# Patient Record
Sex: Female | Born: 1985 | Hispanic: Yes | Marital: Married | Smoking: Never smoker
Health system: Southern US, Community
[De-identification: ages and names within clinical notes are randomized; demographics above are authoritative.]

## PROBLEM LIST (undated history)

## (undated) ENCOUNTER — Inpatient Hospital Stay (HOSPITAL_COMMUNITY): Payer: Self-pay

## (undated) DIAGNOSIS — O24419 Gestational diabetes mellitus in pregnancy, unspecified control: Secondary | ICD-10-CM

---

## 2014-09-03 ENCOUNTER — Encounter (HOSPITAL_COMMUNITY): Payer: Self-pay | Admitting: *Deleted

## 2014-09-03 ENCOUNTER — Inpatient Hospital Stay (HOSPITAL_COMMUNITY)
Admission: AD | Admit: 2014-09-03 | Discharge: 2014-09-03 | Disposition: A | Discharge status: Home / Self Care | Payer: Self-pay | Source: Ambulatory Visit | Attending: Family Medicine | Admitting: Family Medicine

## 2014-09-03 ENCOUNTER — Inpatient Hospital Stay (HOSPITAL_COMMUNITY): Payer: Self-pay

## 2014-09-03 DIAGNOSIS — O209 Hemorrhage in early pregnancy, unspecified: Secondary | ICD-10-CM | POA: Insufficient documentation

## 2014-09-03 DIAGNOSIS — Z3A01 Less than 8 weeks gestation of pregnancy: Secondary | ICD-10-CM | POA: Insufficient documentation

## 2014-09-03 LAB — HIV ANTIBODY (ROUTINE TESTING W REFLEX): HIV 1&2 Ab, 4th Generation: NONREACTIVE

## 2014-09-03 LAB — URINALYSIS, ROUTINE W REFLEX MICROSCOPIC
Bilirubin Urine: NEGATIVE
Glucose, UA: NEGATIVE mg/dL
KETONES UR: NEGATIVE mg/dL
Leukocytes, UA: NEGATIVE
Nitrite: NEGATIVE
PROTEIN: NEGATIVE mg/dL
Specific Gravity, Urine: 1.02 (ref 1.005–1.030)
UROBILINOGEN UA: 0.2 mg/dL (ref 0.0–1.0)
pH: 6 (ref 5.0–8.0)

## 2014-09-03 LAB — URINE MICROSCOPIC-ADD ON

## 2014-09-03 LAB — CBC
HEMATOCRIT: 37.9 % (ref 36.0–46.0)
Hemoglobin: 12.6 g/dL (ref 12.0–15.0)
MCH: 28.3 pg (ref 26.0–34.0)
MCHC: 33.2 g/dL (ref 30.0–36.0)
MCV: 85.2 fL (ref 78.0–100.0)
Platelets: 243 10*3/uL (ref 150–400)
RBC: 4.45 MIL/uL (ref 3.87–5.11)
RDW: 13.9 % (ref 11.5–15.5)
WBC: 5.3 10*3/uL (ref 4.0–10.5)

## 2014-09-03 LAB — WET PREP, GENITAL
CLUE CELLS WET PREP: NONE SEEN
Trich, Wet Prep: NONE SEEN
Yeast Wet Prep HPF POC: NONE SEEN

## 2014-09-03 LAB — HCG, QUANTITATIVE, PREGNANCY: hCG, Beta Chain, Quant, S: 4592 m[IU]/mL — ABNORMAL HIGH (ref <5)

## 2014-09-03 LAB — ABO/RH: ABO/RH(D): O POS

## 2014-09-03 LAB — POCT PREGNANCY, URINE: PREG TEST UR: POSITIVE — AB

## 2014-09-03 NOTE — Discharge Instructions (Signed)
Hemorragia vaginal durante el embarazo (primer trimestre) °(Vaginal Bleeding During Pregnancy, First Trimester) °Durante los primeros meses de embarazo, es común tener una pequeña hemorragia vaginal (manchas). A veces, la hemorragia es normal y no representa un problema, pero en algunas ocasiones es un síntoma de algo grave. Asegúrese de decirle a su médico de inmediato si tiene algún tipo de hemorragia vaginal. °CUIDADOS EN EL HOGAR °· Controle su afección para ver si hay cambios. °· Siga las indicaciones de su médico con respecto al grado de actividad que puede tener. °· Si debe hacer reposo en cama: °¨ Es posible que deba quedarse en cama y levantarse únicamente para ir al baño. °¨ Quizás le permitan hacer algunas actividades. °¨ Si es necesario, planifique que alguien la ayude. °· Escriba: °¨ La cantidad de toallas higiénicas que usa cada día. °¨ La frecuencia con la que se cambia las toallas higiénicas. °¨ Indique que tan empapados (saturados) están. °· No use tampones. °· No se haga duchas vaginales. °· No tenga relaciones sexuales ni orgasmos hasta que el médico la autorice. °· Si elimina tejido por la vagina, guárdelo para mostrárselo al médico. °· Tome los medicamentos solamente como se lo haya indicado el médico. °· No tome aspirina, ya que puede causar hemorragias. °· Concurra a todas las visitas de control como se lo haya indicado el médico. °SOLICITE AYUDA SI:  °· Tiene una hemorragia vaginal. °· Tiene cólicos. °· Tiene dolores de parto. °· Tiene fiebre que no desaparece después de tomar medicamentos. °SOLICITE AYUDA DE INMEDIATO SI:  °· Siente cólicos muy intensos en la espalda o en el vientre (abdomen). °· Elimina coágulos grandes o tejido por la vagina. °· Tiene más hemorragia. °· Se siente débil o que va a desvanecerse. °· Pierde el conocimiento (se desmaya). °· Tiene escalofríos. °· Tiene una pérdida importante o sale líquido a borbotones por la vagina. °· Se desmaya mientras defeca. °ASEGÚRESE DE  QUE: °· Comprende estas instrucciones. °· Controlará su afección. °· Recibirá ayuda de inmediato si no mejora o si empeora. °Document Released: 01/22/2014 °ExitCare® Patient Information ©2015 [employer]. This information is not intended to replace advice given to you by your health care provider. Make sure you discuss any questions you have with your health care provider. ° °

## 2014-09-03 NOTE — MAU Note (Signed)
Patient presents [redacted] weeks pregnant with complaints of vaginal bleeding and abdominal pain.

## 2014-09-03 NOTE — MAU Provider Note (Signed)
Chief Complaint  Patient presents with  . Abdominal Pain  . Vaginal Bleeding   Interpreter here for interview and exam  Subjective [name] Marshall 28 y.o.  G3P2002 at 7964w0d by LMP presents with onset 5 days ago of first episode of small amount pink vaginal spotting. The bleeding changed to read and now brown. She is also having mild menstrual-like crampy lower abdominal pain. Last intercourse yesterday.  Denies irritative vaginal discharge. No dysuria or hematuria.  Blood type: unknown  Pregnancy course: NPC  Pertinent Medical History:[place] Pertinent Ob/Gyn History: C/S x2 Pertinent Surgical History: C/S Pertinent Social History: nonsmoker  No prescriptions prior to admission    Allergies not on file   Objective   Filed Vitals:   09/03/14 1325  BP: 134/64  Pulse: 103  Temp: 98.1 F (36.7 C)  Resp: 16     Physical Exam General: WN/WD in NAD  Abdom: soft, NT External genitalia: normal; BUS neg  SSE: small amount brown blood; cervix with no lesions, appears closed Bimanual: Cervix closed, long; uterus NT, 4-6 weeks size; adnexa nontender, no masses   Lab Results Results for orders placed or performed during the hospital encounter of 09/03/14 (from the past 24 hour(s))  Urinalysis, Routine w reflex microscopic     Status: Abnormal   Collection Time: 09/03/14  1:44 PM  Result Value Ref Range   Color, Urine YELLOW YELLOW   APPearance CLEAR CLEAR   Specific Gravity, Urine 1.020 1.005 - 1.030   pH 6.0 5.0 - 8.0   Glucose, UA NEGATIVE NEGATIVE mg/dL   Hgb urine dipstick TRACE (A) NEGATIVE   Bilirubin Urine NEGATIVE NEGATIVE   Ketones, ur NEGATIVE NEGATIVE mg/dL   Protein, ur NEGATIVE NEGATIVE mg/dL   Urobilinogen, UA 0.2 0.0 - 1.0 mg/dL   Nitrite NEGATIVE NEGATIVE   Leukocytes, UA NEGATIVE NEGATIVE  Urine microscopic-add on     Status: Abnormal   Collection Time: 09/03/14  1:44 PM  Result Value Ref Range   Squamous Epithelial / LPF FEW (A) RARE   WBC, UA 0-2 <3  WBC/hpf   Urine-Other MUCOUS PRESENT   Pregnancy, urine POC     Status: Abnormal   Collection Time: 09/03/14  1:51 PM  Result Value Ref Range   Preg Test, Ur POSITIVE (A) NEGATIVE  hCG, quantitative, pregnancy     Status: Abnormal   Collection Time: 09/03/14  3:35 PM  Result Value Ref Range   hCG, Beta Chain, Quant, S 4592 (H) <5 mIU/mL    Ultrasound  CLINICAL DATA: Bleeding in early pregnancy  EXAM: OBSTETRIC <14 WK US AND TRANSVAGINAL OB US  TECHNIQUE: Both transabdominal and transvaginal ultrasound examinations were performed for complete evaluation of the gestation as well as the maternal uterus, adnexal regions, and pelvic cul-de-sac. Transvaginal technique was performed to assess early pregnancy.  COMPARISON: None.  FINDINGS: Intrauterine gestational sac: Visualized/normal in shape.  Yolk sac: Not visualized  Embryo: Not visualized  Cardiac Activity: Not visual  Heart Rate: bpm  MSD: 8.3 mm 5 w 3 d  CRL: mm w d US EDC: 05/09/2015  Maternal uterus/adnexae: No subchorionic hemorrhage. No adnexal masses or free fluid  IMPRESSION: Early intrauterine gestational sac without fetal pole or yolk sac currently. This could be followed with repeat ultrasound in 2 weeks to ensure continued expected progression. No acute maternal findings.   Electronically Signed  By: Charlett NoseKevin Dover M.D.      Assessment 1. Bleeding in early pregnancy   Pregnancy location unknown and viability not determined  [id number] at 5454w0d  Plan    GC/CT, HIV sent Discharge home with ectopic precautions See AVS for pt education   Medication List    STOP taking these medications        ibuprofen 200 MG tablet  Commonly known as:  ADVIL,MOTRIN     sodium-potassium bicarbonate Tbef dissolvable tablet  Commonly known as:  ALKA-SELTZER GOLD      TAKE these medications        prenatal multivitamin Tabs tablet  Take 1 tablet by  mouth daily at 12 noon.         Follow-up Information    Follow up with Nursepractioner [name] Marshall In 2 days.   Why:  Repeat blood test       [name] Marshall 09/03/2014 3:14 PM

## 2014-09-04 LAB — GC/CHLAMYDIA PROBE AMP
CT PROBE, AMP APTIMA: NEGATIVE
GC Probe RNA: NEGATIVE

## 2014-09-16 ENCOUNTER — Inpatient Hospital Stay (HOSPITAL_COMMUNITY)
Admission: AD | Admit: 2014-09-16 | Discharge: 2014-09-16 | Disposition: A | Discharge status: Home / Self Care | Payer: Self-pay | Source: Ambulatory Visit | Attending: Obstetrics and Gynecology | Admitting: Obstetrics and Gynecology

## 2014-09-16 ENCOUNTER — Inpatient Hospital Stay (HOSPITAL_COMMUNITY): Payer: Self-pay

## 2014-09-16 ENCOUNTER — Encounter (HOSPITAL_COMMUNITY): Payer: Self-pay | Admitting: *Deleted

## 2014-09-16 DIAGNOSIS — O034 Incomplete spontaneous abortion without complication: Secondary | ICD-10-CM | POA: Insufficient documentation

## 2014-09-16 DIAGNOSIS — Z3A01 Less than 8 weeks gestation of pregnancy: Secondary | ICD-10-CM | POA: Insufficient documentation

## 2014-09-16 DIAGNOSIS — O032 Embolism following incomplete spontaneous abortion: Secondary | ICD-10-CM

## 2014-09-16 DIAGNOSIS — O209 Hemorrhage in early pregnancy, unspecified: Secondary | ICD-10-CM

## 2014-09-16 HISTORY — DX: Gestational diabetes mellitus in pregnancy, unspecified control: O24.419

## 2014-09-16 LAB — CBC
HCT: 35.6 % — ABNORMAL LOW (ref 36.0–46.0)
HEMOGLOBIN: 12 g/dL (ref 12.0–15.0)
MCH: 28.7 pg (ref 26.0–34.0)
MCHC: 33.7 g/dL (ref 30.0–36.0)
MCV: 85.2 fL (ref 78.0–100.0)
Platelets: 236 10*3/uL (ref 150–400)
RBC: 4.18 MIL/uL (ref 3.87–5.11)
RDW: 13.5 % (ref 11.5–15.5)
WBC: 7.3 10*3/uL (ref 4.0–10.5)

## 2014-09-16 LAB — URINE MICROSCOPIC-ADD ON

## 2014-09-16 LAB — GLUCOSE, CAPILLARY: GLUCOSE-CAPILLARY: 95 mg/dL (ref 70–99)

## 2014-09-16 LAB — URINALYSIS, ROUTINE W REFLEX MICROSCOPIC
Bilirubin Urine: NEGATIVE
GLUCOSE, UA: 100 mg/dL — AB
Ketones, ur: 15 mg/dL — AB
Nitrite: POSITIVE — AB
Specific Gravity, Urine: 1.02 (ref 1.005–1.030)
Urobilinogen, UA: 2 mg/dL — ABNORMAL HIGH (ref 0.0–1.0)
pH: 7.5 (ref 5.0–8.0)

## 2014-09-16 LAB — HCG, QUANTITATIVE, PREGNANCY: hCG, Beta Chain, Quant, S: 11833 m[IU]/mL — ABNORMAL HIGH (ref <5)

## 2014-09-16 MED ORDER — OXYCODONE-ACETAMINOPHEN 5-325 MG PO TABS: 1.0000 | ORAL_TABLET | ORAL | Status: DC | PRN

## 2014-09-16 NOTE — Progress Notes (Signed)
Assisted provider with interpretation of results from ultrasound.  Spanish Interpreter - [name] GlassmanBenita Marshall

## 2014-09-16 NOTE — MAU Provider Note (Signed)
No chief complaint on file. Interpreter here  Subjective [name] Marshall 28 y.o.  G3P2002 at 1477w6d by LMP with second visit for EPB. On 09/03/14 US showed IUGS c/w 8025w2d, no YS. Quant was 4592. She DNKA for F/U quant scheduled for 12/16. Since then Was having light spotting until it became heavier yesterday and today has had bright red bleeding with clots. Filled 2 pads with blood today. Did not pass any obvious tissue.Having cramping as well. Denies irritative vaginal discharge. No dysuria or hematuria.  GC/CT, WP, HIV  were neg. Pregnancy undesired. Pregnancy diagnosed at visit to get Nexplanon.  Blood type: O pos  Pregnancy course: NPC  Pertinent Medical History: n/c Pertinent Ob/Gyn History: C/S x2 Pertinent Surgical History: C/S Pertinent Social History: nonsmoker  Prescriptions prior to admission  Medication Sig Dispense Refill Last Dose  . Prenatal Vit-Fe Fumarate-FA (MULTIVITAMIN-PRENATAL) 27-0.8 MG TABS tablet Take 1 tablet by mouth daily at 12 noon.   Past Week at Unknown time  . Prenatal Vit-Fe Fumarate-FA (PRENATAL MULTIVITAMIN) TABS tablet Take 1 tablet by mouth daily at 12 noon.   09/03/2014 at Unknown time    No Known Allergies   Objective   Filed Vitals:   09/16/14 1916  BP: 123/83  Pulse: 92  Temp: 98.5 F (36.9 C)  Resp: 16     Physical Exam General: WN/WD in NAD  Abdom: soft, NT External genitalia: normal; BUS neg  SSE: moderate amount blood, 2-4 cm clots removed from cx with spongestick; cervix with no lesions, appears closed Bimanual: Cervix closed, long; uterus anteverted, NT, 4-6 weeks size; adnexa nontender, no masses   Lab Results Results for orders placed or performed during the hospital encounter of 09/16/14 (from the past 24 hour(s))  Urinalysis, Routine w reflex microscopic     Status: Abnormal   Collection Time: 09/16/14  6:54 PM  Result Value Ref Range   Color, Urine RED (A) YELLOW   APPearance TURBID (A) CLEAR   Specific Gravity, Urine  1.020 1.005 - 1.030   pH 7.5 5.0 - 8.0   Glucose, UA 100 (A) NEGATIVE mg/dL   Hgb urine dipstick LARGE (A) NEGATIVE   Bilirubin Urine NEGATIVE NEGATIVE   Ketones, ur 15 (A) NEGATIVE mg/dL   Protein, ur >161>300 (A) NEGATIVE mg/dL   Urobilinogen, UA 2.0 (H) 0.0 - 1.0 mg/dL   Nitrite POSITIVE (A) NEGATIVE   Leukocytes, UA SMALL (A) NEGATIVE  Urine microscopic-add on     Status: None   Collection Time: 09/16/14  6:54 PM  Result Value Ref Range   WBC, UA 0-2 <3 WBC/hpf   RBC / HPF TOO NUMEROUS TO COUNT <3 RBC/hpf   Bacteria, UA RARE RARE  CBC     Status: Abnormal   Collection Time: 09/16/14  7:35 PM  Result Value Ref Range   WBC 7.3 4.0 - 10.5 K/uL   RBC 4.18 3.87 - 5.11 MIL/uL   Hemoglobin 12.0 12.0 - 15.0 g/dL   HCT 09.635.6 (L) 04.536.0 - 40.946.0 %   MCV 85.2 78.0 - 100.0 fL   MCH 28.7 26.0 - 34.0 pg   MCHC 33.7 30.0 - 36.0 g/dL   RDW 81.113.5 91.411.5 - 78.215.5 %   Platelets 236 150 - 400 K/uL  Glucose, capillary     Status: None   Collection Time: 09/16/14  7:54 PM  Result Value Ref Range   Glucose-Capillary 95 70 - 99 mg/dL       Ref Range 9:567:35 PM  13d ago  hCG, Beta Chain, Quant, S <5 mIU/mL 11833 (H) 4592 (H)CM   Comments:          Ultrasound  No results found. MAU Course Discussed EPF  Likely incomplete SAB with pt and option of cytotec or expectant managaement> chooses expectant management  Assessment 1. Incomplete miscarriage with blood clot   2. Bleeding in early pregnancy   Most likely inc SAB   Plan     Discharge home with bleeding precautions See AVS for pt education   Medication List    STOP taking these medications        multivitamin-prenatal 27-0.8 MG Tabs tablet      TAKE these medications        oxyCODONE-acetaminophen 5-325 MG per tablet  Commonly known as:  PERCOCET/ROXICET  Take 1 tablet by mouth every 4 (four) hours as needed.     prenatal multivitamin Tabs tablet  Take 1 tablet by mouth daily at 12 noon.         Follow-up Information     Follow up with [facility] On 09/24/2014.   Why:  For lab test and [facility]   Contact information:   [address] [place] [id number] [phone]        [facility],Daurice Ovando 09/16/2014 7:14 PM

## 2014-09-16 NOTE — Progress Notes (Signed)
Assisted RN with interpretation of initial assessment. °Spanish Interpreter - Benita Sanchez  °

## 2014-09-16 NOTE — Progress Notes (Signed)
Assisted Midwife with interpretation of procedure/assessment.  Spanish Interpreter - Joselyn GlassmanBenita Sanchez

## 2014-09-16 NOTE — MAU Note (Signed)
Pt was here last week with sm amt bleeding and her uterus was enlarged upon U/S, but nothing showed up in the uterus.  Bleeding increased today, went through one pad today and had several sm clots.  Pt reports a very sm amt of low abd cramping. Denies any probs with urinating

## 2014-09-16 NOTE — Progress Notes (Signed)
Assisted admissions with interpretation of patient information.  Spanish Interpreter - Joselyn GlassmanBenita Sanchez

## 2014-09-16 NOTE — Progress Notes (Signed)
Assisted Ultrasound tech with interpretation of ultrasound procedure.  Spanish Interpreter - [name] GlassmanBenita Marshall

## 2014-09-16 NOTE — Progress Notes (Signed)
Assisted RN with interpretation of discharge instructions  °Spanish Interpreter - Benita Sanchez °

## 2014-09-24 ENCOUNTER — Other Ambulatory Visit: Payer: Self-pay

## 2014-09-24 ENCOUNTER — Telehealth: Payer: Self-pay | Admitting: *Deleted

## 2014-09-24 DIAGNOSIS — O209 Hemorrhage in early pregnancy, unspecified: Secondary | ICD-10-CM

## 2014-09-24 LAB — HCG, QUANTITATIVE, PREGNANCY: HCG, BETA CHAIN, QUANT, S: 237 m[IU]/mL

## 2014-09-24 NOTE — Telephone Encounter (Signed)
Contacted patient with Spanish interpreter Pearletha Alfred,  Pt informed of lab appointment.  Pt was unaware of appointment, pt can come today @ 1:30pm.

## 2014-09-25 ENCOUNTER — Telehealth: Payer: Self-pay

## 2014-09-25 NOTE — Telephone Encounter (Signed)
-----   Message from Levie HeritageJacob J Stinson, DO sent at 09/24/2014  8:38 PM EST ----- Regarding: bHCG quant HCG quant 237.  Should repeat quant in 2 weeks

## 2014-09-26 NOTE — Telephone Encounter (Signed)
Called pt with Spanish [name] Marshall informed pt that we need to her to come in on January 18th.  I explained to pt what the purpose of a repeat beta is to make sure that her levels fall less than 2.  Pt stated that she would be able to come in on January 18th @ 0900 for lab draw.  Pt had no further questions.

## 2014-10-08 ENCOUNTER — Other Ambulatory Visit: Payer: Self-pay

## 2014-10-08 DIAGNOSIS — O209 Hemorrhage in early pregnancy, unspecified: Secondary | ICD-10-CM

## 2014-10-09 LAB — HCG, QUANTITATIVE, PREGNANCY: HCG, BETA CHAIN, QUANT, S: 134 m[IU]/mL

## 2014-10-10 ENCOUNTER — Telehealth: Payer: Self-pay | Admitting: General Practice

## 2014-10-10 NOTE — Telephone Encounter (Signed)
-----   Message from Tereso NewcomerUgonna A Anyanwu, MD sent at 10/09/2014 12:49 PM EST ----- Repeat HCG in two weeks, can also have visit to discuss contraception/future pregnancy plans at that time with any provider.

## 2014-10-10 NOTE — Telephone Encounter (Signed)
Called patient with [facility] for interpreter, no answer- left message stating we are trying to reach you with results, please call us back at the clinics

## 2014-10-11 NOTE — Telephone Encounter (Signed)
Patient informed of results and follow up appointment made.

## 2014-10-11 NOTE — Telephone Encounter (Signed)
Beronica to schedule appointment for 2 weeks.

## 2014-10-25 ENCOUNTER — Encounter: Payer: Self-pay | Admitting: Family Medicine

## 2014-10-25 ENCOUNTER — Ambulatory Visit (INDEPENDENT_AMBULATORY_CARE_PROVIDER_SITE_OTHER): Payer: Self-pay | Admitting: Family Medicine

## 2014-10-25 VITALS — BP 136/67 | HR 84 | Temp 98.5°F | Wt 136.2 lb

## 2014-10-25 DIAGNOSIS — O034 Incomplete spontaneous abortion without complication: Secondary | ICD-10-CM

## 2014-10-25 NOTE — Progress Notes (Signed)
   Subjective:    Patient ID: [name] Marshall, female    DOB: 03-13-1986, 29 y.o.   MRN: [id number]  HPI  [id number]  On 09/03/14 US showed IUGS c/w 3155w2d, no YS. Quant was 4592 > 11833 > 237 >134.  No pain, spotting resolved.  LMP: Saturday or Sunday (Jan 24th).   Review of Systems  Constitutional: Negative for chills and diaphoresis.  Respiratory: Negative for cough and shortness of breath.   Cardiovascular: Negative for leg swelling.  Gastrointestinal: Negative for abdominal pain, diarrhea, constipation and anal bleeding.  Endocrine: Negative for cold intolerance and heat intolerance.  Genitourinary: Negative for dysuria, urgency, decreased urine volume and difficulty urinating.  Neurological: Negative for headaches.       Objective:   Physical Exam  Constitutional: She appears well-developed and well-nourished. No distress.  HENT:  Mouth/Throat: Mucous membranes are moist. Pharynx is normal.  Eyes: Conjunctivae and EOM are normal.  Neck: No adenopathy.  Cardiovascular: Normal rate and S2 normal.   Pulmonary/Chest: Effort normal.  Abdominal: She exhibits no distension. There is no tenderness.  Musculoskeletal: Normal range of motion.  Neurological: She is alert. No cranial nerve deficit. Coordination normal.  Skin: Skin is warm. No rash noted. She is not diaphoretic. No pallor.          Assessment & Plan:  [name] was seen today for follow-up.  Diagnoses and associated orders for this visit:  Incomplete spontaneous abortion - B-HCG Quant - nexplanon to be placed at [facility]  Perry MountACOSTA,Jojuan Champney Evee, MD 1:39 PM

## 2014-10-25 NOTE — Progress Notes (Signed)
Pt is interested in nexplanon. Will go to health dept for this.

## 2014-10-26 LAB — HCG, QUANTITATIVE, PREGNANCY: HCG, BETA CHAIN, QUANT, S: 11.1 m[IU]/mL

## 2014-10-29 ENCOUNTER — Telehealth: Payer: Self-pay | Admitting: *Deleted

## 2014-10-29 NOTE — Telephone Encounter (Signed)
Called [name] Marshall with interpreter Maretta LosBlanca Lindner and gave her results of bhcg and explained is dropping normally , but want it to be less than 2.0. Informed her doctor wants her to come on 18th for bhcg only.  Donald SivaRocio says she can't come anyday but Tuesday because husband is off then, wants to come 11/13/14 at 9am,   I explained that is fine, as long as she is not having any problems, She voiced understanding.

## 2014-10-29 NOTE — Telephone Encounter (Signed)
-----   Message from Perry MountKristy Arilla Acosta, MD sent at 10/27/2014  3:36 PM EST ----- Please have patient come back for repeat HCG quant on 2-18.  Now down to 11.1, moving in right direction and decreasing appropriately.  Needs to be less than 2.  Spanish only pt  [place]  ----- Message -----    From: Lab in [facility] Interface    Sent: 10/26/2014   1:03 AM      To: Perry MountKristy Edie Acosta, MD

## 2014-11-13 ENCOUNTER — Other Ambulatory Visit: Payer: Self-pay

## 2014-11-13 DIAGNOSIS — O034 Incomplete spontaneous abortion without complication: Secondary | ICD-10-CM

## 2014-11-14 ENCOUNTER — Telehealth: Payer: Self-pay | Admitting: General Practice

## 2014-11-14 LAB — HCG, QUANTITATIVE, PREGNANCY: hCG, Beta Chain, Quant, S: <2 m[IU]/mL

## 2014-11-14 NOTE — Telephone Encounter (Signed)
-----   Message from Tereso NewcomerUgonna A Anyanwu, MD sent at 11/14/2014 11:45 AM EST ----- HCG is <2.0. [facility] as previously discussed with Dr. Loreta AveAcosta. Spanish speaking.  Please call to inform patient of results and recommendations.

## 2014-11-14 NOTE — Telephone Encounter (Signed)
Called patient with [name] Marshall and informed her of results and recommendations. Patient verbalized understanding and had no questions  

## 2016-01-04 ENCOUNTER — Emergency Department (HOSPITAL_COMMUNITY): Payer: Medicaid Other

## 2016-01-04 ENCOUNTER — Inpatient Hospital Stay (HOSPITAL_COMMUNITY)
Admission: EM | Admit: 2016-01-04 | Discharge: 2016-01-09 | DRG: 330 | Disposition: A | Discharge status: Home / Self Care | Payer: Medicaid Other | Attending: Surgery | Admitting: Surgery

## 2016-01-04 ENCOUNTER — Encounter (HOSPITAL_COMMUNITY): Payer: Self-pay

## 2016-01-04 DIAGNOSIS — Z833 Family history of diabetes mellitus: Secondary | ICD-10-CM

## 2016-01-04 DIAGNOSIS — K567 Ileus, unspecified: Secondary | ICD-10-CM | POA: Diagnosis not present

## 2016-01-04 DIAGNOSIS — K353 Acute appendicitis with localized peritonitis: Secondary | ICD-10-CM | POA: Diagnosis present

## 2016-01-04 DIAGNOSIS — K9189 Other postprocedural complications and disorders of digestive system: Secondary | ICD-10-CM | POA: Diagnosis not present

## 2016-01-04 DIAGNOSIS — R109 Unspecified abdominal pain: Secondary | ICD-10-CM | POA: Diagnosis present

## 2016-01-04 DIAGNOSIS — Z8249 Family history of ischemic heart disease and other diseases of the circulatory system: Secondary | ICD-10-CM

## 2016-01-04 DIAGNOSIS — K3580 Unspecified acute appendicitis: Secondary | ICD-10-CM

## 2016-01-04 DIAGNOSIS — K630 Abscess of intestine: Secondary | ICD-10-CM | POA: Diagnosis present

## 2016-01-04 DIAGNOSIS — R1031 Right lower quadrant pain: Secondary | ICD-10-CM | POA: Diagnosis present

## 2016-01-04 LAB — COMPREHENSIVE METABOLIC PANEL
ALK PHOS: 62 U/L (ref 38–126)
ALT: 38 U/L (ref 14–54)
AST: 81 U/L — ABNORMAL HIGH (ref 15–41)
Albumin: 4.2 g/dL (ref 3.5–5.0)
Anion gap: 12 (ref 5–15)
BILIRUBIN TOTAL: 0.8 mg/dL (ref 0.3–1.2)
BUN: 12 mg/dL (ref 6–20)
CALCIUM: 8.6 mg/dL — AB (ref 8.9–10.3)
CO2: 21 mmol/L — AB (ref 22–32)
CREATININE: 0.72 mg/dL (ref 0.44–1.00)
Chloride: 101 mmol/L (ref 101–111)
GFR calc non Af Amer: >60 mL/min (ref >60)
Glucose, Bld: 131 mg/dL — ABNORMAL HIGH (ref 65–99)
Potassium: 3.2 mmol/L — ABNORMAL LOW (ref 3.5–5.1)
SODIUM: 134 mmol/L — AB (ref 135–145)
TOTAL PROTEIN: 7.9 g/dL (ref 6.5–8.1)

## 2016-01-04 LAB — WET PREP, GENITAL
Clue Cells Wet Prep HPF POC: NONE SEEN
SPERM: NONE SEEN
TRICH WET PREP: NONE SEEN
Yeast Wet Prep HPF POC: NONE SEEN

## 2016-01-04 LAB — DIFFERENTIAL
BASOS PCT: 0 %
Basophils Absolute: 0 10*3/uL (ref 0.0–0.1)
EOS PCT: 0 %
Eosinophils Absolute: 0 10*3/uL (ref 0.0–0.7)
LYMPHS PCT: 6 %
Lymphs Abs: 1.1 10*3/uL (ref 0.7–4.0)
Monocytes Absolute: 1.1 10*3/uL — ABNORMAL HIGH (ref 0.1–1.0)
Monocytes Relative: 6 %
NEUTROS ABS: 16.9 10*3/uL — AB (ref 1.7–7.7)
NEUTROS PCT: 88 %

## 2016-01-04 LAB — URINALYSIS, ROUTINE W REFLEX MICROSCOPIC
Bilirubin Urine: NEGATIVE
Glucose, UA: NEGATIVE mg/dL
Hgb urine dipstick: NEGATIVE
KETONES UR: 40 mg/dL — AB
Nitrite: NEGATIVE
PROTEIN: NEGATIVE mg/dL
Specific Gravity, Urine: 1.027 (ref 1.005–1.030)
pH: 6 (ref 5.0–8.0)

## 2016-01-04 LAB — CBC
HCT: 36.9 % (ref 36.0–46.0)
Hemoglobin: 12.6 g/dL (ref 12.0–15.0)
MCH: 26.7 pg (ref 26.0–34.0)
MCHC: 34.1 g/dL (ref 30.0–36.0)
MCV: 78.2 fL (ref 78.0–100.0)
PLATELETS: 239 10*3/uL (ref 150–400)
RBC: 4.72 MIL/uL (ref 3.87–5.11)
RDW: 14.4 % (ref 11.5–15.5)
WBC: 19.8 10*3/uL — ABNORMAL HIGH (ref 4.0–10.5)

## 2016-01-04 LAB — LIPASE, BLOOD: Lipase: 16 U/L (ref 11–51)

## 2016-01-04 LAB — URINE MICROSCOPIC-ADD ON: RBC / HPF: NONE SEEN RBC/hpf (ref 0–5)

## 2016-01-04 LAB — POC URINE PREG, ED: PREG TEST UR: NEGATIVE

## 2016-01-04 MED ORDER — SODIUM CHLORIDE 0.9 % IV SOLN
Freq: Once | INTRAVENOUS | Status: AC
  Administered 2016-01-04: 18:00:00 via INTRAVENOUS

## 2016-01-04 MED ORDER — SODIUM CHLORIDE 0.9 % IV BOLUS (SEPSIS)
1000.0000 mL | Freq: Once | INTRAVENOUS | Status: AC
  Administered 2016-01-04: 1000 mL via INTRAVENOUS

## 2016-01-04 MED ORDER — HYDROMORPHONE HCL 1 MG/ML IJ SOLN
1.0000 mg | INTRAMUSCULAR | Status: DC | PRN
  Administered 2016-01-04 – 2016-01-06 (×10): 1 mg via INTRAVENOUS
  Filled 2016-01-04 (×11): qty 1

## 2016-01-04 MED ORDER — METRONIDAZOLE IN NACL 5-0.79 MG/ML-% IV SOLN
500.0000 mg | Freq: Three times a day (TID) | INTRAVENOUS | Status: DC
  Administered 2016-01-05 – 2016-01-09 (×13): 500 mg via INTRAVENOUS
  Filled 2016-01-04 (×14): qty 100

## 2016-01-04 MED ORDER — ACETAMINOPHEN 500 MG PO TABS
1000.0000 mg | ORAL_TABLET | Freq: Four times a day (QID) | ORAL | Status: DC
  Administered 2016-01-04 – 2016-01-09 (×15): 1000 mg via ORAL
  Filled 2016-01-04 (×34): qty 2

## 2016-01-04 MED ORDER — METRONIDAZOLE IN NACL 5-0.79 MG/ML-% IV SOLN
500.0000 mg | Freq: Once | INTRAVENOUS | Status: AC
  Administered 2016-01-04: 500 mg via INTRAVENOUS
  Filled 2016-01-04: qty 100

## 2016-01-04 MED ORDER — PANTOPRAZOLE SODIUM 40 MG IV SOLR
40.0000 mg | Freq: Every day | INTRAVENOUS | Status: DC
  Administered 2016-01-04 – 2016-01-08 (×5): 40 mg via INTRAVENOUS
  Filled 2016-01-04 (×6): qty 40

## 2016-01-04 MED ORDER — ACETAMINOPHEN 325 MG PO TABS
650.0000 mg | ORAL_TABLET | Freq: Once | ORAL | Status: AC
  Administered 2016-01-04: 650 mg via ORAL
  Filled 2016-01-04: qty 2

## 2016-01-04 MED ORDER — IOPAMIDOL (ISOVUE-300) INJECTION 61%
100.0000 mL | Freq: Once | INTRAVENOUS | Status: AC | PRN
  Administered 2016-01-04: 100 mL via INTRAVENOUS

## 2016-01-04 MED ORDER — KCL IN DEXTROSE-NACL 20-5-0.9 MEQ/L-%-% IV SOLN
INTRAVENOUS | Status: DC
  Administered 2016-01-04 – 2016-01-08 (×8): via INTRAVENOUS
  Filled 2016-01-04 (×12): qty 1000

## 2016-01-04 MED ORDER — MORPHINE SULFATE (PF) 4 MG/ML IV SOLN
4.0000 mg | Freq: Once | INTRAVENOUS | Status: AC
  Administered 2016-01-04: 4 mg via INTRAVENOUS
  Filled 2016-01-04: qty 1

## 2016-01-04 MED ORDER — DEXTROSE 5 % IV SOLN
2.0000 g | Freq: Once | INTRAVENOUS | Status: AC
  Administered 2016-01-04: 2 g via INTRAVENOUS
  Filled 2016-01-04: qty 2

## 2016-01-04 MED ORDER — ONDANSETRON HCL 4 MG/2ML IJ SOLN
4.0000 mg | Freq: Once | INTRAMUSCULAR | Status: AC
  Administered 2016-01-04: 4 mg via INTRAVENOUS
  Filled 2016-01-04: qty 2

## 2016-01-04 MED ORDER — ONDANSETRON HCL 4 MG/2ML IJ SOLN
4.0000 mg | Freq: Four times a day (QID) | INTRAMUSCULAR | Status: DC | PRN
  Administered 2016-01-05 – 2016-01-07 (×4): 4 mg via INTRAVENOUS
  Filled 2016-01-04 (×6): qty 2

## 2016-01-04 MED ORDER — METHOCARBAMOL 500 MG PO TABS
500.0000 mg | ORAL_TABLET | Freq: Four times a day (QID) | ORAL | Status: DC | PRN
  Administered 2016-01-05: 500 mg via ORAL
  Filled 2016-01-04: qty 1

## 2016-01-04 MED ORDER — DEXTROSE 5 % IV SOLN
2.0000 g | Freq: Three times a day (TID) | INTRAVENOUS | Status: DC
  Administered 2016-01-04 – 2016-01-09 (×14): 2 g via INTRAVENOUS
  Filled 2016-01-04 (×15): qty 2

## 2016-01-04 MED ORDER — ONDANSETRON 4 MG PO TBDP: 4.0000 mg | ORAL_TABLET | Freq: Four times a day (QID) | ORAL | Status: DC | PRN

## 2016-01-04 MED ORDER — ENOXAPARIN SODIUM 40 MG/0.4ML ~~LOC~~ SOLN
40.0000 mg | SUBCUTANEOUS | Status: DC
  Administered 2016-01-04 – 2016-01-05 (×2): 40 mg via SUBCUTANEOUS
  Filled 2016-01-04 (×6): qty 0.4

## 2016-01-04 MED ORDER — OXYCODONE HCL 5 MG PO TABS
5.0000 mg | ORAL_TABLET | ORAL | Status: DC | PRN
  Administered 2016-01-05: 10 mg via ORAL
  Administered 2016-01-06 – 2016-01-07 (×4): 5 mg via ORAL
  Filled 2016-01-04: qty 2
  Filled 2016-01-04 (×3): qty 1
  Filled 2016-01-04: qty 2

## 2016-01-04 NOTE — ED Notes (Signed)
Pt is requesting an interpreter

## 2016-01-04 NOTE — ED Notes (Signed)
Pt presents with c/o abdominal pain, LLQ. Pt came from a medical clinic that reported to EMS that they believes she was having an ectopic pregnancy. Pt's LMP 12/04/15. 20 g R AC started by EMS, NS running.

## 2016-01-04 NOTE — ED Notes (Signed)
Bed: [facility] Expected date: 01/04/16 Expected time: 1:19 PM Means of arrival:  Comments: Abd pain, R/o eptopic (from clinic) LMP 12/04/15 IV established

## 2016-01-04 NOTE — ED Notes (Signed)
Pt stated unable to give urine sample at this time, given call light and told to let someone know when able

## 2016-01-04 NOTE — ED Notes (Addendum)
Pt reports that she has not had a positive pregnancy test at home. Pt reports that he clinic believed she was having a pregnancy related problem because she is having lower right quadrant pain, no pregnancy test obtained at the clinic, clinic mentioned questionable ectopic. Pt still has her appendix.

## 2016-01-04 NOTE — ED Notes (Signed)
Pa  at bedside. 

## 2016-01-04 NOTE — ED Provider Notes (Signed)
CSN: [id number]     Arrival date & time 01/04/16  1320 History   First MD Initiated Contact with Patient 01/04/16 1507     Chief Complaint  Patient presents with  . Abdominal Pain     (Consider location/radiation/quality/duration/timing/severity/associated sxs/prior Treatment) HPI [name] Marshall is a 30 y.o. female with no medical problems, presents to ED with complaint of abdominal pain. Pt states pain started  Yesterday, worsened today. Wet to UC, sent here for further evaluation. Pain is in the right lower quadrant, radiates all over. Pain is sharp. worsened with movement.  Pt reports associated nausea, vomiting, fever. Pt tried ginger drink for nausea this morning, no other tx prior to coming in. Not sure if pregnant, last period 1 month ago. Pt reports some diarrhea. No hematemesis, no blood in stool. No urinary symptoms or vaginal complains. No back pain. No hx of the same pain.   Past Medical History  Diagnosis Date  . Gestational diabetes     with second pregnancy   Past Surgical History  Procedure Laterality Date  . Cesarean section     No family history on file. Social History  Substance Use Topics  . Smoking status: Never Smoker   . Smokeless tobacco: Never Used  . Alcohol Use: No   OB History    Gravida Para Term Preterm AB TAB SAB Ectopic Multiple Living   3 2 2  0 0 0 0 0 0 2     Review of Systems  Constitutional: Negative for fever and chills.  Respiratory: Negative for cough, chest tightness and shortness of breath.   Cardiovascular: Negative for chest pain, palpitations and leg swelling.  Gastrointestinal: Positive for nausea, vomiting, abdominal pain and diarrhea.  Genitourinary: Negative for dysuria, flank pain, vaginal bleeding, vaginal discharge, vaginal pain and pelvic pain.  Musculoskeletal: Negative for myalgias, arthralgias, neck pain and neck stiffness.  Skin: Negative for rash.  Neurological: Negative for dizziness, weakness and headaches.  All  other systems reviewed and are negative.     Allergies  Review of patient's allergies indicates no known allergies.  Home Medications   Prior to Admission medications   Medication Sig Start Date End Date Taking? Authorizing Provider  ibuprofen (ADVIL,MOTRIN) 200 MG tablet Take 400 mg by mouth every 6 (six) hours as needed for mild pain or moderate pain.    Yes Historical Provider, MD   BP 114/71 mmHg  Pulse 117  Temp(Src) 100.5 F (38.1 C) (Oral)  Resp 18  SpO2 99%  LMP 12/04/2015 (Approximate) Physical Exam  Constitutional: She appears well-developed and well-nourished. No distress.  HENT:  Head: Normocephalic.  Eyes: Conjunctivae are normal.  Neck: Neck supple.  Cardiovascular: Normal rate, regular rhythm and normal heart sounds.   Pulmonary/Chest: Effort normal and breath sounds normal. No respiratory distress. She has no wheezes. She has no rales.  Abdominal: Soft. Bowel sounds are normal. She exhibits no distension. There is tenderness. There is no rebound.  RLQ tenderness with guarding  Genitourinary:  Normal external genitalia. Normal vaginal canal. Small thin white discharge. Cervix is normal, closed. No CMT. No uterine or adnexal tenderness. No masses palpated.    Musculoskeletal: She exhibits no edema.  Neurological: She is alert.  Skin: Skin is warm and dry.  Psychiatric: She has a normal mood and affect. Her behavior is normal.  Nursing note and vitals reviewed.   ED Course  Procedures (including critical care time) Labs Review Labs Reviewed  COMPREHENSIVE METABOLIC PANEL - Abnormal; Notable for the following:  Sodium 134 (*)    Potassium 3.2 (*)    CO2 21 (*)    Glucose, Bld 131 (*)    Calcium 8.6 (*)    AST 81 (*)    All other components within normal limits  CBC - Abnormal; Notable for the following:    WBC 19.8 (*)    All other components within normal limits  URINALYSIS, ROUTINE W REFLEX MICROSCOPIC (NOT AT [facility]) - Abnormal; Notable for the  following:    APPearance CLOUDY (*)    Ketones, ur 40 (*)    Leukocytes, UA SMALL (*)    All other components within normal limits  URINE MICROSCOPIC-ADD ON - Abnormal; Notable for the following:    Squamous Epithelial / LPF 0-5 (*)    Bacteria, UA FEW (*)    All other components within normal limits  WET PREP, GENITAL  LIPASE, BLOOD  DIFFERENTIAL  POC URINE PREG, ED  GC/CHLAMYDIA PROBE AMP ([place]) NOT AT [facility]    Imaging Review Ct Abdomen Pelvis W Contrast  01/04/2016  CLINICAL DATA:  30 year old female presenting with left lower quadrant abdominal pain. EXAM: CT ABDOMEN AND PELVIS WITH CONTRAST TECHNIQUE: Multidetector CT imaging of the abdomen and pelvis was performed using the standard protocol following bolus administration of intravenous contrast. CONTRAST:  ISOVUE-300 IOPAMIDOL (ISOVUE-300) INJECTION 61% COMPARISON:  No priors. FINDINGS: Lower chest:  Unremarkable. Hepatobiliary: No cystic or solid hepatic lesions. No intra or extrahepatic biliary ductal dilatation. Gallbladder is normal in appearance. Pancreas: No pancreatic mass. No pancreatic ductal dilatation. No pancreatic or peripancreatic fluid or inflammatory changes. Spleen: Unremarkable. Adrenals/Urinary Tract: Bilateral adrenal glands and bilateral kidneys are normal in appearance. No hydroureteronephrosis. Urinary bladder is normal in appearance. Stomach/Bowel: Normal appearance of the stomach. No pathologic dilatation of small bowel or colon. The appendix is markedly enlarged measuring up to 13 mm in diameter, and appears inflamed with surrounding fat stranding. Notably, in the neck of the appendix there are several small appendicoliths. There is also some inflammatory thickening of the adjacent portion of the cecum. Specifically, coronal image 59 of series 5 and sagittal image 40 of series 6 demonstrate a low-attenuation rim enhancing collection which appears to be intimately associated with the wall of the cecum,  surrounded by a thickened mural tissue, which is concerning for potential periappendiceal abscess that has extended into the wall of the cecum. Vascular/Lymphatic: No significant atherosclerotic disease, aneurysm or dissection identified in the abdominal or pelvic vasculature. No lymphadenopathy noted in the abdomen or pelvis. Reproductive: Uterus and ovaries are unremarkable in appearance. Other: Small amount of periappendiceal free fluid. Trace volume of ascites. No pneumoperitoneum. Musculoskeletal: There are no aggressive appearing lytic or blastic lesions noted in the visualized portions of the skeleton. IMPRESSION: 1. Acute appendicitis with possible periappendiceal abscess forming in the posterior wall of the cecum, as discussed above. Adjacent small amount of periappendiceal free fluid. Surgical consultation is strongly recommended. 2. Additional incidental findings, as above. These results were called by telephone at the time of interpretation on 01/04/2016 at 5:30 pm to Dr. Jaynie Crumble, who verbally acknowledged these results. Electronically Signed   By: Trudie Reed M.D.   On: 01/04/2016 17:30   I have personally reviewed and evaluated these images and lab results as part of my medical decision-making.   EKG Interpretation None      MDM   Final diagnoses:  Acute appendicitis, unspecified acute appendicitis type   Pt with RLQ  Pain. Pelvic exam unremarkable. Preg negative.  Exam most consistent with appendicitis especially in the setting of low grade fever and elevated WBC. Will get CT abd and a pelvis for further elvaluation.    6:01 PM CT as described above. Spoke with General Surgery, will come by and evaluate pt.  Rocephin and flaglyl started.  Filed Vitals:   01/04/16 1330 01/04/16 1502  BP: 122/74 114/71  Pulse: 120 117  Temp: 98.3 F (36.8 C) 100.5 F (38.1 C)  TempSrc: Oral Oral  Resp: 16 18  SpO2: 98% 99%     Jaynie Crumble, PA-C 01/04/16  1801  Azalia Bilis, MD 01/05/16 [phone]

## 2016-01-04 NOTE — H&P (Signed)
Bunny Chicoine is an 30 y.o. female.   Chief Complaint: abdominal pain HPI: Asked to see patient at the request of Dr Venora Maples for 1 day history of abdominal pain right lower quadrant.   Pain started yesterday.  Not severe currently but she has  Bouts of episodic crampy abdominal pain.  No blood in her stool.   No severe episodes of constipation.     Past Medical History  Diagnosis Date  . Gestational diabetes     with second pregnancy    Past Surgical History  Procedure Laterality Date  . Cesarean section      No family history on file. Social History:  reports that she has never smoked. She has never used smokeless tobacco. She reports that she does not drink alcohol or use illicit drugs.  Allergies: No Known Allergies   (Not in a hospital admission)  Results for orders placed or performed during the hospital encounter of 01/04/16 (from the past 48 hour(s))  Lipase, blood     Status: None   Collection Time: 01/04/16  1:45 PM  Result Value Ref Range   Lipase 16 11 - 51 U/L  Comprehensive metabolic panel     Status: Abnormal   Collection Time: 01/04/16  1:45 PM  Result Value Ref Range   Sodium 134 (L) 135 - 145 mmol/L   Potassium 3.2 (L) 3.5 - 5.1 mmol/L   Chloride 101 101 - 111 mmol/L   CO2 21 (L) 22 - 32 mmol/L   Glucose, Bld 131 (H) 65 - 99 mg/dL   BUN 12 6 - 20 mg/dL   Creatinine, Ser 0.72 0.44 - 1.00 mg/dL   Calcium 8.6 (L) 8.9 - 10.3 mg/dL   Total Protein 7.9 6.5 - 8.1 g/dL   Albumin 4.2 3.5 - 5.0 g/dL   AST 81 (H) 15 - 41 U/L   ALT 38 14 - 54 U/L   Alkaline Phosphatase 62 38 - 126 U/L   Total Bilirubin 0.8 0.3 - 1.2 mg/dL   GFR calc non Af Amer >60 >60 mL/min   GFR calc Af Amer >60 >60 mL/min    Comment: (NOTE) The eGFR has been calculated using the CKD EPI equation. This calculation has not been validated in all clinical situations. eGFR's persistently <60 mL/min signify possible Chronic Kidney Disease.    Anion gap 12 5 - 15  CBC     Status: Abnormal    Collection Time: 01/04/16  1:45 PM  Result Value Ref Range   WBC 19.8 (H) 4.0 - 10.5 K/uL   RBC 4.72 3.87 - 5.11 MIL/uL   Hemoglobin 12.6 12.0 - 15.0 g/dL   HCT 36.9 36.0 - 46.0 %   MCV 78.2 78.0 - 100.0 fL   MCH 26.7 26.0 - 34.0 pg   MCHC 34.1 30.0 - 36.0 g/dL   RDW 14.4 11.5 - 15.5 %   Platelets 239 150 - 400 K/uL  Differential     Status: Abnormal   Collection Time: 01/04/16  1:45 PM  Result Value Ref Range   Neutrophils Relative % 88 %   Neutro Abs 16.9 (H) 1.7 - 7.7 K/uL   Lymphocytes Relative 6 %   Lymphs Abs 1.1 0.7 - 4.0 K/uL   Monocytes Relative 6 %   Monocytes Absolute 1.1 (H) 0.1 - 1.0 K/uL   Eosinophils Relative 0 %   Eosinophils Absolute 0.0 0.0 - 0.7 K/uL   Basophils Relative 0 %   Basophils Absolute 0.0 0.0 - 0.1 K/uL  Urinalysis, Routine w reflex microscopic (not at [facility])     Status: Abnormal   Collection Time: 01/04/16  2:35 PM  Result Value Ref Range   Color, Urine YELLOW YELLOW   APPearance CLOUDY (A) CLEAR   Specific Gravity, Urine 1.027 1.005 - 1.030   pH 6.0 5.0 - 8.0   Glucose, UA NEGATIVE NEGATIVE mg/dL   Hgb urine dipstick NEGATIVE NEGATIVE   Bilirubin Urine NEGATIVE NEGATIVE   Ketones, ur 40 (A) NEGATIVE mg/dL   Protein, ur NEGATIVE NEGATIVE mg/dL   Nitrite NEGATIVE NEGATIVE   Leukocytes, UA SMALL (A) NEGATIVE  Urine microscopic-add on     Status: Abnormal   Collection Time: 01/04/16  2:35 PM  Result Value Ref Range   Squamous Epithelial / LPF 0-5 (A) NONE SEEN   WBC, UA 0-5 0 - 5 WBC/hpf   RBC / HPF NONE SEEN 0 - 5 RBC/hpf   Bacteria, UA FEW (A) NONE SEEN  POC urine preg, ED (not at [facility])     Status: None   Collection Time: 01/04/16  2:45 PM  Result Value Ref Range   Preg Test, Ur NEGATIVE NEGATIVE    Comment:        THE SENSITIVITY OF THIS METHODOLOGY IS >24 mIU/mL   Wet prep, genital     Status: Abnormal   Collection Time: 01/04/16  4:00 PM  Result Value Ref Range   Yeast Wet Prep HPF POC NONE SEEN NONE SEEN   Trich, Wet Prep NONE  SEEN NONE SEEN   Clue Cells Wet Prep HPF POC NONE SEEN NONE SEEN   WBC, Wet Prep HPF POC MANY (A) NONE SEEN   Sperm NONE SEEN    Ct Abdomen Pelvis W Contrast  01/04/2016  CLINICAL DATA:  30 year old female presenting with left lower quadrant abdominal pain. EXAM: CT ABDOMEN AND PELVIS WITH CONTRAST TECHNIQUE: Multidetector CT imaging of the abdomen and pelvis was performed using the standard protocol following bolus administration of intravenous contrast. CONTRAST:  179m ISOVUE-300 IOPAMIDOL (ISOVUE-300) INJECTION 61% COMPARISON:  No priors. FINDINGS: Lower chest:  Unremarkable. Hepatobiliary: No cystic or solid hepatic lesions. No intra or extrahepatic biliary ductal dilatation. Gallbladder is normal in appearance. Pancreas: No pancreatic mass. No pancreatic ductal dilatation. No pancreatic or peripancreatic fluid or inflammatory changes. Spleen: Unremarkable. Adrenals/Urinary Tract: Bilateral adrenal glands and bilateral kidneys are normal in appearance. No hydroureteronephrosis. Urinary bladder is normal in appearance. Stomach/Bowel: Normal appearance of the stomach. No pathologic dilatation of small bowel or colon. The appendix is markedly enlarged measuring up to 13 mm in diameter, and appears inflamed with surrounding fat stranding. Notably, in the neck of the appendix there are several small appendicoliths. There is also some inflammatory thickening of the adjacent portion of the cecum. Specifically, coronal image 59 of series 5 and sagittal image 40 of series 6 demonstrate a low-attenuation rim enhancing collection which appears to be intimately associated with the wall of the cecum, surrounded by a thickened mural tissue, which is concerning for potential periappendiceal abscess that has extended into the wall of the cecum. Vascular/Lymphatic: No significant atherosclerotic disease, aneurysm or dissection identified in the abdominal or pelvic vasculature. No lymphadenopathy noted in the abdomen or  pelvis. Reproductive: Uterus and ovaries are unremarkable in appearance. Other: Small amount of periappendiceal free fluid. Trace volume of ascites. No pneumoperitoneum. Musculoskeletal: There are no aggressive appearing lytic or blastic lesions noted in the visualized portions of the skeleton. IMPRESSION: 1. Acute appendicitis with possible periappendiceal abscess forming in the  posterior wall of the cecum, as discussed above. Adjacent small amount of periappendiceal free fluid. Surgical consultation is strongly recommended. 2. Additional incidental findings, as above. These results were called by telephone at the time of interpretation on 01/04/2016 at 5:30 pm to Dr. Jeannett Senior, who verbally acknowledged these results. Electronically Signed   By: Vinnie Langton M.D.   On: 01/04/2016 17:30    Review of Systems  Constitutional: Negative for fever and chills.  Respiratory: Negative.   Cardiovascular: Negative.   Gastrointestinal: Positive for abdominal pain and constipation. Negative for diarrhea.  Musculoskeletal: Negative.   Neurological: Negative.   Psychiatric/Behavioral: Negative.     Blood pressure 108/57, pulse 103, temperature 98.3 F (36.8 C), temperature source Oral, resp. rate 20, last menstrual period 12/04/2015, SpO2 99 %, unknown if currently breastfeeding. Physical Exam  Constitutional: She appears well-developed and well-nourished.  HENT:  Head: Normocephalic and atraumatic.  Eyes: Pupils are equal, round, and reactive to light. No scleral icterus.  Neck: Normal range of motion. Neck supple.  Cardiovascular: Normal rate.   Respiratory: Effort normal and breath sounds normal.  GI: Soft. She exhibits no distension and no mass. There is tenderness in the right lower quadrant. There is no rigidity, no rebound and no guarding. No hernia.     Assessment/Plan Acute appendicitis with possible abscess vs cecal mass with obstruction of appendix  Not very tender on exam and  no significant rebound or guarding  Perplexing CT    May need further work up  Will admit for IV ABX for tonight and reassess IN AM  If more tender,  Will proceed with laparoscopy  / laparotomy and she may need possible cecectomy  Discussed with her and her husband at the bedside   Meshulem Onorato A., MD 01/04/2016, 6:52 PM

## 2016-01-04 NOTE — ED Notes (Signed)
Pt is still denying pain @ present.  States "it comes and goes.  When it comes it's a 10, worse than being in labor."

## 2016-01-05 ENCOUNTER — Encounter (HOSPITAL_COMMUNITY): Admission: EM | Disposition: A | Discharge status: Home / Self Care | Payer: Self-pay | Source: Home / Self Care

## 2016-01-05 ENCOUNTER — Encounter (HOSPITAL_COMMUNITY): Payer: Self-pay | Admitting: Certified Registered"

## 2016-01-05 ENCOUNTER — Inpatient Hospital Stay (HOSPITAL_COMMUNITY): Payer: Medicaid Other | Admitting: Certified Registered Nurse Anesthetist

## 2016-01-05 HISTORY — PX: LAPAROSCOPIC APPENDECTOMY: SHX408

## 2016-01-05 LAB — COMPREHENSIVE METABOLIC PANEL
ALK PHOS: 56 U/L (ref 38–126)
ALT: 31 U/L (ref 14–54)
AST: 79 U/L — AB (ref 15–41)
Albumin: 3.2 g/dL — ABNORMAL LOW (ref 3.5–5.0)
Anion gap: 6 (ref 5–15)
BILIRUBIN TOTAL: 0.7 mg/dL (ref 0.3–1.2)
BUN: 8 mg/dL (ref 6–20)
CALCIUM: 7.8 mg/dL — AB (ref 8.9–10.3)
CO2: 21 mmol/L — ABNORMAL LOW (ref 22–32)
CREATININE: 0.59 mg/dL (ref 0.44–1.00)
Chloride: 108 mmol/L (ref 101–111)
GFR calc Af Amer: >60 mL/min (ref >60)
Glucose, Bld: 137 mg/dL — ABNORMAL HIGH (ref 65–99)
Potassium: 3.5 mmol/L (ref 3.5–5.1)
Sodium: 135 mmol/L (ref 135–145)
TOTAL PROTEIN: 6.5 g/dL (ref 6.5–8.1)

## 2016-01-05 LAB — CBC
HEMATOCRIT: 32.7 % — AB (ref 36.0–46.0)
Hemoglobin: 10.6 g/dL — ABNORMAL LOW (ref 12.0–15.0)
MCH: 25.8 pg — ABNORMAL LOW (ref 26.0–34.0)
MCHC: 32.4 g/dL (ref 30.0–36.0)
MCV: 79.6 fL (ref 78.0–100.0)
Platelets: 207 10*3/uL (ref 150–400)
RBC: 4.11 MIL/uL (ref 3.87–5.11)
RDW: 14.9 % (ref 11.5–15.5)
WBC: 12 10*3/uL — AB (ref 4.0–10.5)

## 2016-01-05 LAB — SURGICAL PCR SCREEN
MRSA, PCR: NEGATIVE
Staphylococcus aureus: NEGATIVE

## 2016-01-05 SURGERY — APPENDECTOMY, LAPAROSCOPIC
Anesthesia: General | Site: Abdomen

## 2016-01-05 MED ORDER — LACTATED RINGERS IV SOLN
INTRAVENOUS | Status: DC | PRN
  Administered 2016-01-05: 11:00:00 via INTRAVENOUS

## 2016-01-05 MED ORDER — ONDANSETRON HCL 4 MG/2ML IJ SOLN
INTRAMUSCULAR | Status: DC | PRN
  Administered 2016-01-05: 4 mg via INTRAVENOUS

## 2016-01-05 MED ORDER — LIDOCAINE HCL (CARDIAC) 20 MG/ML IV SOLN
INTRAVENOUS | Status: AC
  Filled 2016-01-05: qty 5

## 2016-01-05 MED ORDER — PROPOFOL 10 MG/ML IV BOLUS
INTRAVENOUS | Status: AC
  Filled 2016-01-05: qty 20

## 2016-01-05 MED ORDER — SUGAMMADEX SODIUM 200 MG/2ML IV SOLN
INTRAVENOUS | Status: AC
  Filled 2016-01-05: qty 2

## 2016-01-05 MED ORDER — HYDROMORPHONE HCL 1 MG/ML IJ SOLN
INTRAMUSCULAR | Status: AC
  Filled 2016-01-05: qty 1

## 2016-01-05 MED ORDER — 0.9 % SODIUM CHLORIDE (POUR BTL) OPTIME
TOPICAL | Status: DC | PRN
  Administered 2016-01-05: 4000 mL

## 2016-01-05 MED ORDER — DIPHENHYDRAMINE HCL 50 MG/ML IJ SOLN
12.5000 mg | Freq: Once | INTRAMUSCULAR | Status: AC
  Administered 2016-01-05: 12.5 mg via INTRAVENOUS

## 2016-01-05 MED ORDER — LACTATED RINGERS IV SOLN
INTRAVENOUS | Status: DC | PRN
  Administered 2016-01-05: 3000 mL

## 2016-01-05 MED ORDER — SUGAMMADEX SODIUM 200 MG/2ML IV SOLN
INTRAVENOUS | Status: DC | PRN
  Administered 2016-01-05: 200 mg via INTRAVENOUS

## 2016-01-05 MED ORDER — ROCURONIUM BROMIDE 100 MG/10ML IV SOLN
INTRAVENOUS | Status: AC
  Filled 2016-01-05: qty 1

## 2016-01-05 MED ORDER — HYDROMORPHONE HCL 1 MG/ML IJ SOLN
0.2500 mg | INTRAMUSCULAR | Status: DC | PRN
  Administered 2016-01-05 (×3): 0.5 mg via INTRAVENOUS

## 2016-01-05 MED ORDER — PROPOFOL 10 MG/ML IV BOLUS
INTRAVENOUS | Status: DC | PRN
  Administered 2016-01-05: 130 mg via INTRAVENOUS

## 2016-01-05 MED ORDER — ONDANSETRON HCL 4 MG/2ML IJ SOLN
INTRAMUSCULAR | Status: AC
  Filled 2016-01-05: qty 2

## 2016-01-05 MED ORDER — MIDAZOLAM HCL 5 MG/5ML IJ SOLN
INTRAMUSCULAR | Status: DC | PRN
  Administered 2016-01-05: 2 mg via INTRAVENOUS

## 2016-01-05 MED ORDER — ROCURONIUM BROMIDE 100 MG/10ML IV SOLN
INTRAVENOUS | Status: DC | PRN
  Administered 2016-01-05: 10 mg via INTRAVENOUS
  Administered 2016-01-05: 40 mg via INTRAVENOUS

## 2016-01-05 MED ORDER — DIPHENHYDRAMINE HCL 50 MG/ML IJ SOLN
INTRAMUSCULAR | Status: AC
  Filled 2016-01-05: qty 1

## 2016-01-05 MED ORDER — DEXAMETHASONE SODIUM PHOSPHATE 10 MG/ML IJ SOLN
INTRAMUSCULAR | Status: AC
  Filled 2016-01-05: qty 1

## 2016-01-05 MED ORDER — MIDAZOLAM HCL 2 MG/2ML IJ SOLN
INTRAMUSCULAR | Status: AC
  Filled 2016-01-05: qty 2

## 2016-01-05 MED ORDER — DEXAMETHASONE SODIUM PHOSPHATE 10 MG/ML IJ SOLN
INTRAMUSCULAR | Status: DC | PRN
  Administered 2016-01-05: 10 mg via INTRAVENOUS

## 2016-01-05 MED ORDER — FENTANYL CITRATE (PF) 250 MCG/5ML IJ SOLN
INTRAMUSCULAR | Status: AC
  Filled 2016-01-05: qty 5

## 2016-01-05 MED ORDER — FENTANYL CITRATE (PF) 100 MCG/2ML IJ SOLN
INTRAMUSCULAR | Status: DC | PRN
  Administered 2016-01-05: 50 ug via INTRAVENOUS
  Administered 2016-01-05: 100 ug via INTRAVENOUS
  Administered 2016-01-05 (×2): 50 ug via INTRAVENOUS

## 2016-01-05 MED ORDER — BUPIVACAINE-EPINEPHRINE (PF) 0.25% -1:200000 IJ SOLN
INTRAMUSCULAR | Status: AC
  Filled 2016-01-05: qty 30

## 2016-01-05 MED ORDER — SUCCINYLCHOLINE CHLORIDE 20 MG/ML IJ SOLN
INTRAMUSCULAR | Status: DC | PRN
  Administered 2016-01-05: 100 mg via INTRAVENOUS

## 2016-01-05 SURGICAL SUPPLY — 43 items
APPLIER CLIP ROT 10 11.4 M/L (STAPLE)
CLIP APPLIE ROT 10 11.4 M/L (STAPLE) IMPLANT
COVER SURGICAL LIGHT HANDLE (MISCELLANEOUS) ×3 IMPLANT
CUTTER FLEX LINEAR 45M (STAPLE) ×3 IMPLANT
DECANTER SPIKE VIAL GLASS SM (MISCELLANEOUS) ×3 IMPLANT
DRAPE LAPAROSCOPIC ABDOMINAL (DRAPES) ×3 IMPLANT
DRAPE WARM FLUID 44X44 (DRAPE) ×3 IMPLANT
DRSG OPSITE POSTOP 4X6 (GAUZE/BANDAGES/DRESSINGS) ×3 IMPLANT
ELECT PENCIL ROCKER SW 15FT (MISCELLANEOUS) ×3 IMPLANT
ELECT REM PT RETURN 9FT ADLT (ELECTROSURGICAL) ×3
ELECTRODE REM PT RTRN 9FT ADLT (ELECTROSURGICAL) ×1 IMPLANT
ENDOLOOP SUT PDS II  0 18 (SUTURE)
ENDOLOOP SUT PDS II 0 18 (SUTURE) IMPLANT
GLOVE BIOGEL PI IND STRL 7.0 (GLOVE) ×1 IMPLANT
GLOVE BIOGEL PI INDICATOR 7.0 (GLOVE) ×2
GLOVE INDICATOR 8.0 STRL GRN (GLOVE) ×6 IMPLANT
GLOVE SS BIOGEL STRL SZ 8 (GLOVE) ×1 IMPLANT
GLOVE SUPERSENSE BIOGEL SZ 8 (GLOVE) ×2
GOWN STRL REUS W/TWL LRG LVL3 (GOWN DISPOSABLE) ×3 IMPLANT
GOWN STRL REUS W/TWL XL LVL3 (GOWN DISPOSABLE) ×6 IMPLANT
KIT BASIN OR (CUSTOM PROCEDURE TRAY) ×3 IMPLANT
LIGASURE IMPACT 36 18CM CVD LR (INSTRUMENTS) ×3 IMPLANT
LIQUID BAND (GAUZE/BANDAGES/DRESSINGS) ×3 IMPLANT
PORT LAP GEL ALEXIS MED 5-9CM (MISCELLANEOUS) ×3 IMPLANT
POUCH SPECIMEN RETRIEVAL 10MM (ENDOMECHANICALS) ×3 IMPLANT
RELOAD 45 VASCULAR/THIN (ENDOMECHANICALS) IMPLANT
RELOAD PROXIMATE 75MM BLUE (ENDOMECHANICALS) ×6 IMPLANT
RELOAD STAPLE TA45 3.5 REG BLU (ENDOMECHANICALS) IMPLANT
SET IRRIG TUBING LAPAROSCOPIC (IRRIGATION / IRRIGATOR) ×3 IMPLANT
SHEARS HARMONIC ACE PLUS 36CM (ENDOMECHANICALS) ×3 IMPLANT
STAPLER GUN LINEAR PROX 60 (STAPLE) ×3 IMPLANT
STAPLER PROXIMATE 75MM BLUE (STAPLE) ×3 IMPLANT
SUCTION POOLE TIP (SUCTIONS) ×3 IMPLANT
SUT MNCRL AB 4-0 PS2 18 (SUTURE) ×3 IMPLANT
TOWEL OR 17X26 10 PK STRL BLUE (TOWEL DISPOSABLE) ×3 IMPLANT
TRAY FOLEY W/METER SILVER 14FR (SET/KITS/TRAYS/PACK) ×3 IMPLANT
TRAY FOLEY W/METER SILVER 16FR (SET/KITS/TRAYS/PACK) IMPLANT
TRAY LAPAROSCOPIC (CUSTOM PROCEDURE TRAY) ×3 IMPLANT
TROCAR BLADELESS OPT 5 75 (ENDOMECHANICALS) ×3 IMPLANT
TROCAR XCEL 12X100 BLDLESS (ENDOMECHANICALS) IMPLANT
TROCAR XCEL BLUNT TIP 100MML (ENDOMECHANICALS) ×3 IMPLANT
TUBING INSUF HEATED (TUBING) ×3 IMPLANT
YANKAUER SUCT BULB TIP 10FT TU (MISCELLANEOUS) ×3 IMPLANT

## 2016-01-05 NOTE — Anesthesia Preprocedure Evaluation (Signed)
Anesthesia Evaluation  Patient identified by MRN, date of birth, ID band Patient awake    Reviewed: Allergy & Precautions, H&P , Patient's Chart, lab work & pertinent test results, reviewed documented beta blocker date and time   Airway Mallampati: II  TM Distance: >3 FB Neck ROM: full    Dental no notable dental hx.    Pulmonary    Pulmonary exam normal breath sounds clear to auscultation       Cardiovascular  Rhythm:regular Rate:Normal     Neuro/Psych    GI/Hepatic   Endo/Other  diabetes, Gestational  Renal/GU      Musculoskeletal   Abdominal   Peds  Hematology  (+) anemia ,   Anesthesia Other Findings   Reproductive/Obstetrics                             Anesthesia Physical Anesthesia Plan  ASA: II  Anesthesia Plan: General   Post-op Pain Management:    Induction: Intravenous  Airway Management Planned: Oral ETT  Additional Equipment:   Intra-op Plan:   Post-operative Plan: Extubation in OR  Informed Consent: I have reviewed the patients History and Physical, chart, labs and discussed the procedure including the risks, benefits and alternatives for the proposed anesthesia with the patient or authorized representative who has indicated his/her understanding and acceptance.   Dental Advisory Given and Dental advisory given  Plan Discussed with: CRNA and Surgeon  Anesthesia Plan Comments: (  Discussed general anesthesia, including possible nausea, instrumentation of airway, sore throat,pulmonary aspiration, etc. I asked if the were any outstanding questions, or  concerns before we proceeded. )        Anesthesia Quick Evaluation

## 2016-01-05 NOTE — Brief Op Note (Signed)
01/04/2016 - 01/05/2016  12:27 PM  PATIENT:  [name] Marshall  30 y.o. female  PRE-OPERATIVE DIAGNOSIS:  appendicitis with cecal mass  POST-OPERATIVE DIAGNOSIS:  appendicitis with cecal mass   PROCEDURE:  Procedure(s): APPENDECTOMY LAPAROSCOPIC, OPEN TERMINAL ILEUM RESECTION, CECECTOMY,  (N/A)  SURGEON:  Surgeon(s) and Role:    * Harriette Bouillonhomas Halimah Bewick, MD - Primary    * Darnell Levelodd Gerkin, MD - Assisting       ANESTHESIA:   local and general  EBL:  Total I/O In: -  Out: 250 [Urine:225; Blood:25]  BLOOD ADMINISTERED:none  DRAINS: none   LOCAL MEDICATIONS USED:  MARCAINE     SPECIMEN:  Source of Specimen:  cecum  TI  DISPOSITION OF SPECIMEN:  PATHOLOGY  COUNTS:  YES  TOURNIQUET:  * No tourniquets in log *  DICTATION: .Other Dictation: Dictation Number [id number]  PLAN OF CARE: Admit to inpatient   PATIENT DISPOSITION:  PACU - hemodynamically stable.   Delay start of Pharmacological VTE agent (>24hrs) due to surgical blood loss or risk of bleeding: no

## 2016-01-05 NOTE — Progress Notes (Signed)
Subjective: PT REQUIRED PAIN MEDS MOST OF THE NIGHT   Objective: Vital signs in last 24 hours: Temp:  [98.3 F (36.8 C)-100.5 F (38.1 C)] 98.7 F (37.1 C) (04/16 0518) Pulse Rate:  [92-120] 92 (04/16 0518) Resp:  [16-20] 18 (04/16 0518) BP: (108-122)/(52-74) 109/52 mmHg (04/16 0518) SpO2:  [98 %-100 %] 100 % (04/16 0518) Weight:  [63.1 kg (139 lb 1.8 oz)] 63.1 kg (139 lb 1.8 oz) (04/15 2018) Last BM Date: 01/04/16  Intake/Output from previous day: 04/15 0701 - 04/16 0700 In: 1960 [I.V.:1960] Out: 625 [Urine:625] Intake/Output this shift:    GI: more tender RLQ THAN YESTERDAY  NON DISTENDED   Lab Results:   Recent Labs  01/04/16 1345 01/05/16 0512  WBC 19.8* 12.0*  HGB 12.6 10.6*  HCT 36.9 32.7*  PLT 239 207   BMET  Recent Labs  01/04/16 1345 01/05/16 0512  NA 134* 135  K 3.2* 3.5  CL 101 108  CO2 21* 21*  GLUCOSE 131* 137*  BUN 12 8  CREATININE 0.72 0.59  CALCIUM 8.6* 7.8*   PT/INR No results for input(s): LABPROT, INR in the last 72 hours. ABG No results for input(s): PHART, HCO3 in the last 72 hours.  Invalid input(s): PCO2, PO2  Studies/Results: Ct Abdomen Pelvis W Contrast  01/04/2016  CLINICAL DATA:  30 year old female presenting with left lower quadrant abdominal pain. EXAM: CT ABDOMEN AND PELVIS WITH CONTRAST TECHNIQUE: Multidetector CT imaging of the abdomen and pelvis was performed using the standard protocol following bolus administration of intravenous contrast. CONTRAST:  ISOVUE-300 IOPAMIDOL (ISOVUE-300) INJECTION 61% COMPARISON:  No priors. FINDINGS: Lower chest:  Unremarkable. Hepatobiliary: No cystic or solid hepatic lesions. No intra or extrahepatic biliary ductal dilatation. Gallbladder is normal in appearance. Pancreas: No pancreatic mass. No pancreatic ductal dilatation. No pancreatic or peripancreatic fluid or inflammatory changes. Spleen: Unremarkable. Adrenals/Urinary Tract: Bilateral adrenal glands and bilateral kidneys  are normal in appearance. No hydroureteronephrosis. Urinary bladder is normal in appearance. Stomach/Bowel: Normal appearance of the stomach. No pathologic dilatation of small bowel or colon. The appendix is markedly enlarged measuring up to 13 mm in diameter, and appears inflamed with surrounding fat stranding. Notably, in the neck of the appendix there are several small appendicoliths. There is also some inflammatory thickening of the adjacent portion of the cecum. Specifically, coronal image 59 of series 5 and sagittal image 40 of series 6 demonstrate a low-attenuation rim enhancing collection which appears to be intimately associated with the wall of the cecum, surrounded by a thickened mural tissue, which is concerning for potential periappendiceal abscess that has extended into the wall of the cecum. Vascular/Lymphatic: No significant atherosclerotic disease, aneurysm or dissection identified in the abdominal or pelvic vasculature. No lymphadenopathy noted in the abdomen or pelvis. Reproductive: Uterus and ovaries are unremarkable in appearance. Other: Small amount of periappendiceal free fluid. Trace volume of ascites. No pneumoperitoneum. Musculoskeletal: There are no aggressive appearing lytic or blastic lesions noted in the visualized portions of the skeleton. IMPRESSION: 1. Acute appendicitis with possible periappendiceal abscess forming in the posterior wall of the cecum, as discussed above. Adjacent small amount of periappendiceal free fluid. Surgical consultation is strongly recommended. 2. Additional incidental findings, as above. These results were called by telephone at the time of interpretation on 01/04/2016 at 5:30 pm to Dr. Jaynie Crumble, who verbally acknowledged these results. Electronically Signed   By: Trudie Reed M.D.   On: 01/04/2016 17:30    Anti-infectives: Anti-infectives    Start  Dose/Rate Route Frequency Ordered Stop   01/04/16 1915  ceFEPIme (MAXIPIME) 2 g in  dextrose 5 % 50 mL IVPB     2 g 100 mL/hr over 30 Minutes Intravenous 3 times per day 01/04/16 1907     01/04/16 1915  metroNIDAZOLE (FLAGYL) IVPB 500 mg     500 mg 100 mL/hr over 60 Minutes Intravenous Every 8 hours 01/04/16 1907     01/04/16 1745  cefTRIAXone (ROCEPHIN) 2 g in dextrose 5 % 50 mL IVPB     2 g 100 mL/hr over 30 Minutes Intravenous  Once 01/04/16 1731 01/04/16 1814   01/04/16 1745  metroNIDAZOLE (FLAGYL) IVPB 500 mg     500 mg 100 mL/hr over 60 Minutes Intravenous  Once 01/04/16 1731 01/04/16 1932      Assessment/Plan: Abdominal pain Pt not better with ABX and having more RLQ abdominal pain Pt will need laparoscopy and possible laparotomy at this point  Discussed with her husband and patient that a tumor could be present and she may require an ileocecectomy or laparotomy   She may require a second operation as well  Discussed continuing ABX and getting a colonoscopy but her condition is not improving and this is not best for her at this point.     Both patient and husband agree to proceed to surgery at this point for laparoscopic appendectomy   And possible ileocecectomy and open surgery  The procedure has been discussed with the patient.  Alternative therapies have been discussed with the patient.  Operative risks include bleeding,  Infection,  Organ injury,  Nerve injury,  Blood vessel injury,  DVT,  Pulmonary embolism,  Death,  And possible reoperation.  Medical management risks include worsening of present situation.  The success of the procedure is 50 -90 % at treating patients symptoms.  The patient understands and agrees to proceed.  The procedure was discussed with the patient.  Laparoscopic partial colectomy discussed with the patient as well as non operative treatments. The risks of operative management include bleeding,  Infection,  Leak of anastamosis,  Ostomy formation, open procedure,  Sepsis,  Abcess,  Hernia,  DVT,  Pulmonary complications,   Cardiovascular  complications,  Injury to ureter,  Bladder,kidney,and anesthesia risks,  And death. The patient understands.  Questions answered.   The success of the procedure is 50-100  % for treating the patients symptoms. They agree to proceed.   LOS: 1 day    Brice Potteiger A. 01/05/2016

## 2016-01-05 NOTE — Progress Notes (Signed)
Language line used for translation of surgery to pt and husband .

## 2016-01-05 NOTE — Anesthesia Procedure Notes (Signed)
Procedure Name: Intubation Date/Time: 01/05/2016 11:03 AM Performed by: Early OsmondEARGLE, Breklyn Fabrizio E Pre-anesthesia Checklist: Patient identified, Emergency Drugs available, Suction available and Patient being monitored Patient Re-evaluated:Patient Re-evaluated prior to inductionOxygen Delivery Method: Circle System Utilized Preoxygenation: Pre-oxygenation with 100% oxygen Intubation Type: IV induction, Cricoid Pressure applied and Rapid sequence Laryngoscope Size: Miller and 2 Grade View: Grade I Tube type: Oral Number of attempts: 1 Airway Equipment and Method: Stylet Placement Confirmation: ETT inserted through vocal cords under direct vision,  positive ETCO2 and breath sounds checked- equal and bilateral Secured at: 21 cm Tube secured with: Tape Dental Injury: Teeth and Oropharynx as per pre-operative assessment

## 2016-01-05 NOTE — Transfer of Care (Signed)
Immediate Anesthesia Transfer of Care Note  Patient: [name] Marshall  Procedure(s) Performed: Procedure(s): APPENDECTOMY LAPAROSCOPIC, OPEN TERMINAL ILEUM RESECTION, CECECTOMY,  (N/A)  Patient Location: PACU  Anesthesia Type:General  Level of Consciousness:  sedated, patient cooperative and responds to stimulation  Airway & Oxygen Therapy:Patient Spontanous Breathing and Patient connected to face mask oxgen  Post-op Assessment:  Report given to PACU RN and Post -op Vital signs reviewed and stable  Post vital signs:  Reviewed and stable  Last Vitals:  Filed Vitals:   01/05/16 0130 01/05/16 0518  BP: 110/53 109/52  Pulse: 116 92  Temp: 37.7 C 37.1 C  Resp: 16 18    Complications: No apparent anesthesia complications

## 2016-01-06 ENCOUNTER — Encounter (HOSPITAL_COMMUNITY): Payer: Self-pay | Admitting: Surgery

## 2016-01-06 LAB — COMPREHENSIVE METABOLIC PANEL
ALT: 29 U/L (ref 14–54)
ANION GAP: 4 — AB (ref 5–15)
AST: 57 U/L — AB (ref 15–41)
Albumin: 3 g/dL — ABNORMAL LOW (ref 3.5–5.0)
Alkaline Phosphatase: 51 U/L (ref 38–126)
BUN: <5 mg/dL — ABNORMAL LOW (ref 6–20)
CHLORIDE: 108 mmol/L (ref 101–111)
CO2: 23 mmol/L (ref 22–32)
Calcium: 8 mg/dL — ABNORMAL LOW (ref 8.9–10.3)
Creatinine, Ser: 0.5 mg/dL (ref 0.44–1.00)
Glucose, Bld: 173 mg/dL — ABNORMAL HIGH (ref 65–99)
POTASSIUM: 4 mmol/L (ref 3.5–5.1)
Sodium: 135 mmol/L (ref 135–145)
Total Bilirubin: 0.3 mg/dL (ref 0.3–1.2)
Total Protein: 6.4 g/dL — ABNORMAL LOW (ref 6.5–8.1)

## 2016-01-06 LAB — CBC
HEMATOCRIT: 32.1 % — AB (ref 36.0–46.0)
HEMOGLOBIN: 10.7 g/dL — AB (ref 12.0–15.0)
MCH: 26.8 pg (ref 26.0–34.0)
MCHC: 33.3 g/dL (ref 30.0–36.0)
MCV: 80.5 fL (ref 78.0–100.0)
PLATELETS: 236 10*3/uL (ref 150–400)
RBC: 3.99 MIL/uL (ref 3.87–5.11)
RDW: 15 % (ref 11.5–15.5)
WBC: 11.4 10*3/uL — ABNORMAL HIGH (ref 4.0–10.5)

## 2016-01-06 LAB — GC/CHLAMYDIA PROBE AMP (~~LOC~~) NOT AT ARMC
CHLAMYDIA, DNA PROBE: NEGATIVE
NEISSERIA GONORRHEA: NEGATIVE

## 2016-01-06 NOTE — Op Note (Signed)
[name] Kitchen:  [name] Marshall, [name]             ACCOUNT NO.:  [id number]  MEDICAL RECORD NO.:  [id number]  LOCATION:  1528                         [facility]  PHYSICIAN:  [name] Marshall, M.D.DATE OF BIRTH:  [birth year]  DATE OF PROCEDURE:  01/05/2016 DATE OF DISCHARGE:                              OPERATIVE REPORT   PREOPERATIVE DIAGNOSIS:  Perforated appendicitis with possible cecal mass.  POSTOPERATIVE DIAGNOSIS:  Perforated appendicitis with intraluminal periappendiceal and cecal abscess as well as intramural abscess of cecum.  PROCEDURE:  Laparoscopic assisted ileocecectomy.  SURGEON:  [name] Marshall, M.D.  ASSISTANT:  Dr [name] FriendsGerkin MD  ANESTHESIA:  General endotracheal anesthesia with 0.25% Sensorcaine local with epinephrine.  EBL:  25 mL.  SPECIMENS:  Terminal ileum, cecum and appendix to pathology.  DRAINS:  None.  INDICATIONS FOR PROCEDURE:  The patient is a 30 year old female admitted yesterday with abdominal pain for 1 day.  CT scan showed what appeared to be a perforated appendicitis, but a large cecal mass was noted as well.  She was placed in the hospital and placed on IV antibiotics overnight.  Today, her condition was not better, and I recommended laparoscopy with possible appendectomy and ileocecectomy depending on intraoperative findings.  I discussed this with the patient and her husband with the help of a Presenter, broadcastinglanguage translator.  I outlined the risks of surgery to include, but not be exclusive of bleeding, infection, the need to do a cecectomy and or colectomy, ostomy formation, injury to major structures adjacent to the terminal ileum and cecum, injury to the right kidney, injury to the right ureter, injury to the bladder, injury to pelvic female genitalia and uterus, death, DVT, the need for an open procedure, wound complications, potential leakage from the anastomotic site requiring further surgery, and the need for other operations depending on  pathological findings.  This was all explained to her with the help of a translator and her husband voiced understanding and wished to proceed.  Continued medical management was offered, but I did not feel this is going to be useful in her case.  DESCRIPTION OF PROCEDURE:  The patient was brought to the operating room and placed supine on the OR table.  After induction of general endotracheal anesthesia, both arms were tucked and a Foley catheter was placed under sterile conditions.  The abdomen was prepped and draped in a sterile fashion.  Time-out was done.  She was already on preoperative antibiotics.  A 1 cm supraumbilical incision was made.  Dissection was carried down to the fascia.  The fascia was opened in the midline and a pursestring suture of 0-Vicryl was placed and a 12 mm Hasson cannula was placed under direct vision.  Pneumoperitoneum was created to 15 mmHg pressure of CO2 and laparoscope was placed.  A 4 quadrant laparoscopy was performed.  There was significant right lower quadrant adhesions and inflammation consistent with abscess.  Two 5 mm ports were then placed one in the midline halfway between the umbilicus and the xiphoid process and a 2nd just below the umbilicus.  Harmonic scalpel was used to take some loose omental adhesions off the midline of the lower abdominal wall.  I was able then to find  the appendix, which was grossly abnormal, but the terminal ileum appeared and only showed signs of inflammatory change.  The cecum was boggy and hard, and I was able to mobilize the cecum of the right lateral wall attachments with the Harmonic scalpel to rolled this up into the field.  There appeared to be a large perforation of the appendix right at the base of the appendix involving the cecal wall and a large abscess involving the cecal wall as well, which was intramural.  I felt that the ileocecectomy would remove all this disease.  I was then able to roll the remainder  of the cecum up and off the right lateral attachments.  I then elected to place a lower midline hand port between the lower port site and the umbilical port site.  This was about a 6 cm incision.  Dissection was carried down to the fascia. The fascia was opened in the midline and the pneumoperitoneum was released.  A hand port was placed to protect the wound.  I then reached in and pulled out the cecum and terminal ileum and ascending colon easily.  A GIA stapler was used to divide the terminal ileum right at the ileocecal valve.  A second load was used to fire the proximal ascending colon.  This removed the cecum with the help of the LigaSure. The appendix upon examination showed a large necrotic area at the base involving the wall of the cecum.  There was a large intramural abscess and this was passed off the field.  A side-to-side functional end-to-end anastomosis was created using a GIA-75 stapling device and a TA-60 for the common enterotomy.  There was no twisting or kinking of the anastomosis.  Stay sutures were placed at the crotch of the anastomosis. The mesenteric defect was closed with the help of 2-0 Vicryl.  The anastomosis was widely patent.  Of note, there was no bleeding from the anastomosis at the time of closure.  This was placed back in the right lower quadrant without kinking or twisting and there appeared to be no twisting of the small bowel.  A 5 L of irrigation were used to irrigate out the abdominal cavity.  Of note, she had no intraabdominal abscess and the abscess was located within the wall of the cecum.  The remainder of her laparoscopy and laparotomy showed no other significant abnormality.  At this point in time, the fascia was closed with #1 running PDS.  The subcutaneous skin tissues were copiously irrigated after removal of the wound protector and skin staples were used to close skin incisions.  Honeycomb dressing applied.  All final counts of sponge,  needle, and instruments were found to be correct at this portion of the case.  The patient was then awoke, extubated, taken to recovery in satisfactory condition.  EBL 25 mL noted.  Of note, the patient is a Jehovah Witness and this was discussed with the patient and no blood products to be used in her case.     [name] Marshall A. Kipp Shank, M.D.     TAC/MEDQ  D:  01/05/2016  T:  01/06/2016  Job:  [id number]

## 2016-01-06 NOTE — Progress Notes (Signed)
1 Day Post-Op  Subjective: Phone interpreter:  [phone] Pt looks fine, she is still distended and no flatus to speak of so far.  She took something for nausea earlier and is OK with clears this AM so far.  Incision looks fine.  Dressing is dry and intact.    Objective: Vital signs in last 24 hours: Temp:  [97.8 F (36.6 C)-100.2 F (37.9 C)] 98.8 F (37.1 C) (04/17 0500) Pulse Rate:  [81-116] 88 (04/17 0500) Resp:  [11-20] 17 (04/17 0500) BP: (105-135)/(58-78) 105/59 mmHg (04/17 0500) SpO2:  [97 %-100 %] 97 % (04/17 0500) Last BM Date: 01/04/16  Urine 2075 Afebrile, VSS Glucose 173 WBC is still up  Intake/Output from previous day: 04/16 0701 - 04/17 0700 In: 2150 [I.V.:2100; IV Piggyback:50] Out: 2100 [Urine:2075; Blood:25] Intake/Output this shift:    General appearance: alert, cooperative and no distress Resp: clear to auscultation bilaterally GI: soft, distened, no BS, incision is dry and looks fine waffle dessing in place.  Lab Results:   Recent Labs  01/05/16 0512 01/06/16 0422  WBC 12.0* 11.4*  HGB 10.6* 10.7*  HCT 32.7* 32.1*  PLT 207 236    BMET  Recent Labs  01/05/16 0512 01/06/16 0422  NA 135 135  K 3.5 4.0  CL 108 108  CO2 21* 23  GLUCOSE 137* 173*  BUN 8 <5*  CREATININE 0.59 0.50  CALCIUM 7.8* 8.0*   PT/INR No results for input(s): LABPROT, INR in the last 72 hours.   Recent Labs Lab 01/04/16 1345 01/05/16 0512 01/06/16 0422  AST 81* 79* 57*  ALT 38 31 29  ALKPHOS 62 56 51  BILITOT 0.8 0.7 0.3  PROT 7.9 6.5 6.4*  ALBUMIN 4.2 3.2* 3.0*     Lipase     Component Value Date/Time   LIPASE 16 01/04/2016 1345     Studies/Results: Ct Abdomen Pelvis W Contrast  01/04/2016  CLINICAL DATA:  30 year old female presenting with left lower quadrant abdominal pain. EXAM: CT ABDOMEN AND PELVIS WITH CONTRAST TECHNIQUE: Multidetector CT imaging of the abdomen and pelvis was performed using the standard protocol following bolus administration  of intravenous contrast. CONTRAST:  ISOVUE-300 IOPAMIDOL (ISOVUE-300) INJECTION 61% COMPARISON:  No priors. FINDINGS: Lower chest:  Unremarkable. Hepatobiliary: No cystic or solid hepatic lesions. No intra or extrahepatic biliary ductal dilatation. Gallbladder is normal in appearance. Pancreas: No pancreatic mass. No pancreatic ductal dilatation. No pancreatic or peripancreatic fluid or inflammatory changes. Spleen: Unremarkable. Adrenals/Urinary Tract: Bilateral adrenal glands and bilateral kidneys are normal in appearance. No hydroureteronephrosis. Urinary bladder is normal in appearance. Stomach/Bowel: Normal appearance of the stomach. No pathologic dilatation of small bowel or colon. The appendix is markedly enlarged measuring up to 13 mm in diameter, and appears inflamed with surrounding fat stranding. Notably, in the neck of the appendix there are several small appendicoliths. There is also some inflammatory thickening of the adjacent portion of the cecum. Specifically, coronal image 59 of series 5 and sagittal image 40 of series 6 demonstrate a low-attenuation rim enhancing collection which appears to be intimately associated with the wall of the cecum, surrounded by a thickened mural tissue, which is concerning for potential periappendiceal abscess that has extended into the wall of the cecum. Vascular/Lymphatic: No significant atherosclerotic disease, aneurysm or dissection identified in the abdominal or pelvic vasculature. No lymphadenopathy noted in the abdomen or pelvis. Reproductive: Uterus and ovaries are unremarkable in appearance. Other: Small amount of periappendiceal free fluid. Trace volume of ascites. No pneumoperitoneum. Musculoskeletal:  There are no aggressive appearing lytic or blastic lesions noted in the visualized portions of the skeleton. IMPRESSION: 1. Acute appendicitis with possible periappendiceal abscess forming in the posterior wall of the cecum, as discussed above. Adjacent  small amount of periappendiceal free fluid. Surgical consultation is strongly recommended. 2. Additional incidental findings, as above. These results were called by telephone at the time of interpretation on 01/04/2016 at 5:30 pm to Dr. Jaynie CrumbleATYANA KIRICHENKO, who verbally acknowledged these results. Electronically Signed   By: Trudie Reedaniel  Entrikin M.D.   On: 01/04/2016 17:30    Medications: . acetaminophen  1,000 mg Oral 4 times per day  . ceFEPime (MAXIPIME) IV  2 g Intravenous 3 times per day   And  . metronidazole  500 mg Intravenous Q8H  . enoxaparin (LOVENOX) injection  40 mg Subcutaneous Q24H  . pantoprazole (PROTONIX) IV  40 mg Intravenous QHS   . dextrose 5 % and 0.9 % NaCl with KCl 20 mEq/L 100 mL/hr at 01/06/16 0459    Prior to Admission medications   Medication Sig Start Date End Date Taking? Authorizing Provider  ibuprofen (ADVIL,MOTRIN) 200 MG tablet Take 400 mg by mouth every 6 (six) hours as needed for mild pain or moderate pain.    Yes Historical Provider, MD    Assessment/Plan Appendicitis with cecal mass  APPENDECTOMY LAPAROSCOPIC, OPEN TERMINAL ILEUM RESECTION, CECECTOMY,01/05/16, Dr. Luisa Hartornett Antibiotics:  Day 3 Ceftriaxone/Flagyl DVT:  Lovenox/SCD   Plan:  Keep her on clears, d/c foley and mobilize her.  Recheck labs in AM.  Family hx of hypertension and AODM check A1 C in AM.  LOS: 2 days    Haillie Radu 01/06/2016 [phone]

## 2016-01-07 LAB — BASIC METABOLIC PANEL
ANION GAP: 7 (ref 5–15)
BUN: <5 mg/dL — ABNORMAL LOW (ref 6–20)
CHLORIDE: 108 mmol/L (ref 101–111)
CO2: 23 mmol/L (ref 22–32)
CREATININE: 0.51 mg/dL (ref 0.44–1.00)
Calcium: 8 mg/dL — ABNORMAL LOW (ref 8.9–10.3)
GFR calc non Af Amer: >60 mL/min (ref >60)
Glucose, Bld: 118 mg/dL — ABNORMAL HIGH (ref 65–99)
POTASSIUM: 3.7 mmol/L (ref 3.5–5.1)
SODIUM: 138 mmol/L (ref 135–145)

## 2016-01-07 LAB — CBC
HEMATOCRIT: 28.8 % — AB (ref 36.0–46.0)
HEMOGLOBIN: 9.6 g/dL — AB (ref 12.0–15.0)
MCH: 26.3 pg (ref 26.0–34.0)
MCHC: 33.3 g/dL (ref 30.0–36.0)
MCV: 78.9 fL (ref 78.0–100.0)
Platelets: 253 10*3/uL (ref 150–400)
RBC: 3.65 MIL/uL — AB (ref 3.87–5.11)
RDW: 15.1 % (ref 11.5–15.5)
WBC: 10.3 10*3/uL (ref 4.0–10.5)

## 2016-01-07 NOTE — Progress Notes (Signed)
Patient refused Lovenox injection. Patient & spouse educated on the importance of Lovenox post surgical. Patient is ambulatory & wears SCDs

## 2016-01-07 NOTE — Progress Notes (Signed)
2 Days Post-Op  Subjective: She looks fine, no flatus and she has had some nausea with broth.  Site is OK she does have some bowel sounds this AM.    Objective: Vital signs in last 24 hours: Temp:  [98 F (36.7 C)-98.4 F (36.9 C)] 98.2 F (36.8 C) (04/18 0503) Pulse Rate:  [94-108] 94 (04/18 0503) Resp:  [16] 16 (04/18 0503) BP: (113-117)/(61-70) 114/61 mmHg (04/18 0503) SpO2:  [99 %-100 %] 100 % (04/18 0503) Last BM Date: 01/04/16 220 PO Afebrile, VSS Labs OK  Intake/Output from previous day: 04/17 0701 - 04/18 0700 In: 3070 [P.O.:220; I.V.:2400; IV Piggyback:450] Out: 1600 [Urine:1600] Intake/Output this shift:    General appearance: alert, cooperative and no distress GI: soft, sore, sites look fine, few BS, no flatus or BM.  Lab Results:   Recent Labs  01/06/16 0422 01/07/16 0430  WBC 11.4* 10.3  HGB 10.7* 9.6*  HCT 32.1* 28.8*  PLT 236 253    BMET  Recent Labs  01/06/16 0422 01/07/16 0430  NA 135 138  K 4.0 3.7  CL 108 108  CO2 23 23  GLUCOSE 173* 118*  BUN <5* <5*  CREATININE 0.50 0.51  CALCIUM 8.0* 8.0*   PT/INR No results for input(s): LABPROT, INR in the last 72 hours.   Recent Labs Lab 01/04/16 1345 01/05/16 0512 01/06/16 0422  AST 81* 79* 57*  ALT 38 31 29  ALKPHOS 62 56 51  BILITOT 0.8 0.7 0.3  PROT 7.9 6.5 6.4*  ALBUMIN 4.2 3.2* 3.0*     Lipase     Component Value Date/Time   LIPASE 16 01/04/2016 1345     Studies/Results: No results found.  Medications: . acetaminophen  1,000 mg Oral 4 times per day  . ceFEPime (MAXIPIME) IV  2 g Intravenous 3 times per day   And  . metronidazole  500 mg Intravenous Q8H  . enoxaparin (LOVENOX) injection  40 mg Subcutaneous Q24H  . pantoprazole (PROTONIX) IV  40 mg Intravenous QHS    Assessment/Plan Appendicitis with cecal mass APPENDECTOMY LAPAROSCOPIC, OPEN TERMINAL ILEUM RESECTION, CECECTOMY,01/05/16, Dr. Luisa Hartornett Antibiotics: Day 4 Ceftriaxone/Flagyl DVT: Lovenox/SCD     Plan:  i told her I would up her diet when she passes gas.  I will check this PM.  Keep her walking and wait for the ileus to resolve.     LOS: 3 days    [name] Marshall 01/07/2016 [phone]

## 2016-01-08 LAB — HEMOGLOBIN A1C
HEMOGLOBIN A1C: 6 % — AB (ref 4.8–5.6)
MEAN PLASMA GLUCOSE: 126 mg/dL

## 2016-01-08 NOTE — Anesthesia Postprocedure Evaluation (Signed)
Anesthesia Post Note  Patient: [name] Marshall  Procedure(s) Performed: Procedure(s) (LRB): APPENDECTOMY LAPAROSCOPIC, OPEN TERMINAL ILEUM RESECTION, CECECTOMY,  (N/A)  Anesthesia Post Evaluation  Last Vitals:  Filed Vitals:   01/07/16 2107 01/08/16 0524  BP: 122/73 98/55  Pulse: 88 48  Temp: 37.2 C 37.2 C  Resp: 16 16    Last Pain:  Filed Vitals:   01/08/16 0616  PainSc: 2                  Vernecia Umble EDWARD

## 2016-01-08 NOTE — Progress Notes (Signed)
3 Days Post-Op  Subjective: Interpreter: [id number] She is passing flatus and having BM.  She seems a little bloated, but she seems OK with it. No nausea or vomiting.    Objective: Vital signs in last 24 hours: Temp:  [98.3 F (36.8 C)-98.9 F (37.2 C)] 98.9 F (37.2 C) (04/19 0524) Pulse Rate:  [48-97] 48 (04/19 0524) Resp:  [16] 16 (04/19 0524) BP: (98-122)/(55-73) 98/55 mmHg (04/19 0524) SpO2:  [97 %-100 %] 97 % (04/19 0524) Last BM Date: 01/04/16  240 PO  Diet: clear BM x 3 Afebrile, VSS Labs OK yesterday Intake/Output from previous day: 04/18 0701 - 04/19 0700 In: 1140 [P.O.:240; I.V.:900] Out: 1600 [Urine:1600] Intake/Output this shift:    General appearance: alert, cooperative and no distress Resp: clear to auscultation bilaterally GI: distended, incision looks fine.  few BS, + flatus and + BM  Lab Results:   Recent Labs  01/06/16 0422 01/07/16 0430  WBC 11.4* 10.3  HGB 10.7* 9.6*  HCT 32.1* 28.8*  PLT 236 253    BMET  Recent Labs  01/06/16 0422 01/07/16 0430  NA 135 138  K 4.0 3.7  CL 108 108  CO2 23 23  GLUCOSE 173* 118*  BUN <5* <5*  CREATININE 0.50 0.51  CALCIUM 8.0* 8.0*   PT/INR No results for input(s): LABPROT, INR in the last 72 hours.   Recent Labs Lab 01/04/16 1345 01/05/16 0512 01/06/16 0422  AST 81* 79* 57*  ALT 38 31 29  ALKPHOS 62 56 51  BILITOT 0.8 0.7 0.3  PROT 7.9 6.5 6.4*  ALBUMIN 4.2 3.2* 3.0*     Lipase     Component Value Date/Time   LIPASE 16 01/04/2016 1345     Studies/Results: No results found.  Medications: . acetaminophen  1,000 mg Oral 4 times per day  . ceFEPime (MAXIPIME) IV  2 g Intravenous 3 times per day   And  . metronidazole  500 mg Intravenous Q8H  . enoxaparin (LOVENOX) injection  40 mg Subcutaneous Q24H  . pantoprazole (PROTONIX) IV  40 mg Intravenous QHS   . dextrose 5 % and 0.9 % NaCl with KCl 20 mEq/L 75 mL/hr at 01/07/16 2018   Colon, segmental resection, terminal ileum,cecum and  appendix - PERFORATED ACUTE APPENDICITIS WITH ASSOCIATED MARKED ACUTE INFLAMMATION OF THE CECUM. - THE CECUM DEMONSTRATES FULL THICKNESS ACUTE INFLAMMATION INCLUDING SUBMUCOSAL AND INTRAMURAL ABSCESS FORMATION; OVERLYING ULCERATION; AND ACUTE SEROSITIS. - PROXIMAL AND DISTAL MARGINS DEMONSTRATE ACUTE SEROSITIS. - FIVE BENIGN LYMPH NODES (0/5). - NO DYSPLASIA, ATYPIA, OR TUMOR SEEN Assessment/Plan Appendicitis with cecal mass APPENDECTOMY LAPAROSCOPIC, OPEN TERMINAL ILEUM RESECTION, CECECTOMY,01/05/16, Dr. Luisa Hartornett Post op ileus Antibiotics: Day 5 Ceftriaxone/Flagyl DVT: Lovenox/SCD   Plan:  I have ordered her full liquids, and I will recheck her labs in the AM.  She has path above, no tumor, but she did have a perforated appendix.  I will check on transition to PO antibiotics tomorrow, and see how long we want to keep her on antibiotics.  If she does well soft diet in AM and perhaps home tomorrow.     LOS: 4 days    Andrzej Scully 01/08/2016 [id number]

## 2016-01-09 LAB — CBC
HCT: 31 % — ABNORMAL LOW (ref 36.0–46.0)
HEMOGLOBIN: 10.3 g/dL — AB (ref 12.0–15.0)
MCH: 26.1 pg (ref 26.0–34.0)
MCHC: 33.2 g/dL (ref 30.0–36.0)
MCV: 78.5 fL (ref 78.0–100.0)
Platelets: 335 10*3/uL (ref 150–400)
RBC: 3.95 MIL/uL (ref 3.87–5.11)
RDW: 15.1 % (ref 11.5–15.5)
WBC: 9.1 10*3/uL (ref 4.0–10.5)

## 2016-01-09 MED ORDER — ACETAMINOPHEN 325 MG PO TABS: ORAL_TABLET | ORAL | Status: AC

## 2016-01-09 MED ORDER — AMOXICILLIN-POT CLAVULANATE 875-125 MG PO TABS
1.0000 | ORAL_TABLET | Freq: Two times a day (BID) | ORAL | Status: DC
  Administered 2016-01-09: 1 via ORAL
  Filled 2016-01-09 (×2): qty 1

## 2016-01-09 MED ORDER — OXYCODONE-ACETAMINOPHEN 5-325 MG PO TABS: 1.0000 | ORAL_TABLET | ORAL | Status: DC | PRN

## 2016-01-09 MED ORDER — SACCHAROMYCES BOULARDII 250 MG PO CAPS
250.0000 mg | ORAL_CAPSULE | Freq: Two times a day (BID) | ORAL | Status: DC
  Administered 2016-01-09: 250 mg via ORAL
  Filled 2016-01-09: qty 1

## 2016-01-09 MED ORDER — AMOXICILLIN-POT CLAVULANATE 875-125 MG PO TABS: 1.0000 | ORAL_TABLET | Freq: Two times a day (BID) | ORAL | Status: DC

## 2016-01-09 MED ORDER — SACCHAROMYCES BOULARDII 250 MG PO CAPS: ORAL_CAPSULE | ORAL | Status: AC

## 2016-01-09 NOTE — Progress Notes (Signed)
Discharge instructions given in spanish and english to husband and patient . Voice understanding of when to call MD and lift limitations.

## 2016-01-09 NOTE — Discharge Instructions (Signed)
Apendectoma Laparoscpica - Adultos (Laparoscopic Appendectomy, Adult)  Una apendicectoma es un procedimiento que se realiza para extirpar el apndice. La ciruga laparoscpica emplea varios cortes pequeos (incisiones) en lugar de una incisin grande. La ciruga laparoscpica brinda un tiempo ms breve de recuperacin y [place] Corporationmenos molestias.  INFORME A SU MDICO:   Alergias a alimentos o medicamentos.  Medicamentos que [place] Islandsutiliza, incluyendo vitaminas, hierbas, gotas oftlmicas, medicamentos de venta libre y cremas.  Uso de corticoides (por va oral o cremas).  Problemas anteriores debido a anestsicos o a medicamentos que Morgan Stanleydisminuyen la sensibilidad.  Antecedentes de hemorragias o cogulos sanguneos  Cirugas anteriores.  Otros problemas de salud, incluyendo diabetes, problemas cardacos, pulmonares y renales.  Posibilidad de embarazo, si corresponde. RIESGOS Y COMPLICACIONES.   Infecciones. Un germen comienza a desarrollarse en la herida. Generalmente se trata con antibiticos. En algunos casos, la herida debe abrirse y limpiarse.  Hemorragias  Lesiones en los rganos circundantes.  lceras (abscesos).  Dolor crnico en el sitio de la incisin. Se define como un dolor que dura ms de 3 meses.  Cogulos de VF Corporationsangre en las piernas que en algunos casos Circuit Cityvan hacia los pulmones.  Infecciones en el pulmn (neumona). ANTES DEL PROCEDIMIENTO  La apendicectoma se realiza inmediatamente despus de diagnosticar la inflamacin del apndice (apendicitis). No es necesario una preparacin antes del procedimiento.  PROCEDIMIENTO   Le administrarn un medicamento que lo har dormir (anestesia general). Una vez que se encuentre dormido, Musicianle colocarn un tubo flexible (catter) en la vejiga para drenar la orina durante la ciruga. El tubo se retira antes de que usted se despierte de la anestesia. Cuando se encuentre dormido, le insuflarn dixido de carbono en el abdomen. Esto le permitir al cirujano  observar el interior del abdomen y llevar a cabo la Azerbaijanciruga. Le practicarn varias incisiones pequeas en el abdomen. El cirujano insertar un tubo delgado y luminoso (laparoscopio) a travs de una de las incisiones. El cirujano observar por el laparoscopio mientras realiza la [place]ciruga. Se insertarn otros instrumentos quirrgicos a travs de las otras incisiones. La laparoscopia puede no ser apropiada cuando:   Hay una cicatriz importante de una operacin anterior.  El paciente tiene trastornos hemorrgicos.  Si tiene Engineer, petroleumun embarazo en trmino.  Hay otros problemas que hacen imposible el procedimiento laparoscpico, como en caso de una infeccin avanzada o la ruptura del apndice. Si el cirujano estima que no es seguro seguir con el procedimiento laparoscpico cambiar a una ciruga de abdomen abierto. Esto le dar Neomia Dearuna mayor visin y campo para Printmakertrabajar. La ciruga abierta requiere un tiempo ms prolongado de recuperacin. Luego de extirpar el apndice, le cerrarn las incisiones con puntos (suturas) o con un adhesivo para la piel.  DESPUS DEL PROCEDIMIENTO  Lo trasladarn una sala de recuperacin. Cuando el efecto de la anestesia haya desaparecido, lo llevarn a su habitacin en el hospital. Tia AlertLuego del procedimiento le administrarn medicamentos para el dolor para que se sienta confortable. Pregunte a su mdico cunto Furniture conservator/restorertiempo estar en el hospital.    Esta informacin no tiene Theme park managercomo fin reemplazar el consejo del mdico. Asegrese de hacerle al mdico cualquier pregunta que tenga.   Document Released: 09/27/2007 Document Revised: 09/28/2014 Elsevier Interactive Patient Education 2016 [employer]   Apendicectoma laparoscpica en adultos, cuidados posteriores (Laparoscopic Appendectomy, Adult, Care After) Por favor, lea estas instrucciones y consltelas en las prximas semanas. Estas indicaciones le proporcionan informacin general acerca de cmo deber cuidarse despus de dejar el hospital. El  mdico podr darle instrucciones especficas. Aunque  el tratamiento se ha planificado de acuerdo con las prcticas mdicas disponibles ms recientes, ocasionalmente pueden ocurrir complicaciones inevitables. Si tiene problemas o surgen preguntas luego de recibir el alta, por favor comunquese con su mdico. INSTRUCCIONES PARA EL CUIDADO DOMICILIARIO  No conduzca mientras toma medicamentos narcticos prescriptos para Chief Technology Officer.  Si estos medicamentos lo constipan, use un laxante.  Cambie el vendaje tal como se le indic.  Mantenga la herida limpia y seca. Lave suavemente la herida con agua y [place]. Seque suavemente con pequeos golpecitos, sin frotar.  No tome baos, no utilice piscinas ni baeras durante [address], o segn las indicaciones del mdico.  Solo tome medicamentos que se pueden comprar sin receta o recetados para Chief Technology Officer, Dentist o fiebre, como le indica el mdico.  Puede continuar con su dieta normal segn se le haya indicado.  No levante objetos pesados (ms de 5 kg [10 lb] ni realice deportes de contacto durante 3 semanas, o segn las indicaciones.  Despus de la operacin, podr aumentar la actividad de a poco.  Respire profundamente para evitar complicaciones del postoperatorio como neumona. SOLICITE ATENCIN MDICA SI:  Presenta enrojecimiento, hinchazn o aumento del dolor en la herida.  Observa pus en la zona de la herida.  Hay un drenaje en la herida que dura ms de Civil engineer, contracting.  Advierte un olor ftido que proviene de la herida o del vendaje.  La herida se abre (los bordes no estn unidos) luego de la remocin de las suturas.  Nota un incremento del dolor en los hombros (en la zona donde van los breteles) o cerca de los omplatos.  Presenta episodios de mareos o se siente dbil cuando est de pie.  Le falta el aire.  Presenta nuseas o vmitos persistentes.  No puede mover el vientre o tiene intolerancia a los alimentos.  Tiene diarrea. SOLICITE ATENCIN  MDICA INMEDIATAMENTE SI:  Tiene fiebre.  Aparece una erupcin cutnea.  Tiene dificultad ara respirar o siente un dolor agudo en el pecho.  Aparece alguna reaccin o efecto secundario por los medicamentos administrados. ASEGRESE DE QUE:   Comprende estas instrucciones.  Controlar su enfermedad.  Solicitar ayuda de inmediato si no mejora o si empeora.   Esta informacin no tiene Theme park manager el consejo del mdico. Asegrese de hacerle al mdico cualquier pregunta que tenga.   Document Released: 09/27/2007 Document Revised: 01/22/2015 Elsevier Interactive Patient Education 2016 [employer]vin[employer]itor.  CCS      [facility], [place] [phone]  OPEN ABDOMINAL SURGERY: POST OP INSTRUCTIONS  Always review your discharge instruction sheet given to you by the facility where your surgery was performed.  IF YOU HAVE DISABILITY OR FAMILY LEAVE FORMS, YOU MUST BRING THEM TO THE OFFICE FOR PROCESSING.  PLEASE DO NOT GIVE THEM TO YOUR DOCTOR.  1. A prescription for pain medication may be given to you upon discharge.  Take your pain medication as prescribed, if needed.  If narcotic pain medicine is not needed, then you may take acetaminophen (Tylenol) or ibuprofen (Advil) as needed. 2. Take your usually prescribed medications unless otherwise directed. 3. If you need a refill on your pain medication, please contact your pharmacy. They will contact our office to request authorization.  Prescriptions will not be filled after 5pm or on week-ends. 4. You should follow a light diet the first few days after arrival home, such as soup and crackers, pudding, etc.unless your doctor has advised otherwise. A high-fiber, low fat diet can be resumed as tolerated.  Be sure to include lots of fluids daily. Most patients will experience some swelling and bruising on the chest and neck area.  Ice packs will help.  Swelling and bruising can take several days to resolve 5. Most patients will  experience some swelling and bruising in the area of the incision. Ice pack will help. Swelling and bruising can take several days to resolve..  6. It is common to experience some constipation if taking pain medication after surgery.  Increasing fluid intake and taking a stool softener will usually help or prevent this problem from occurring.  A mild laxative (Milk of Magnesia or Miralax) should be taken according to package directions if there are no bowel movements after 48 hours. 7.  You may have steri-strips (small skin tapes) in place directly over the incision.  These strips should be left on the skin for 7-10 days.  If your surgeon used skin glue on the incision, you may shower in 24 hours.  The glue will flake off over the next 2-3 weeks.  Any sutures or staples will be removed at the office during your follow-up visit. You may find that a light gauze bandage over your incision may keep your staples from being rubbed or pulled. You may shower and replace the bandage daily. 8. ACTIVITIES:  You may resume regular (light) daily activities beginning the next day--such as daily self-care, walking, climbing stairs--gradually increasing activities as tolerated.  You may have sexual intercourse when it is comfortable.  Refrain from any heavy lifting or straining until approved by your doctor. a. You may drive when you no longer are taking prescription pain medication, you can comfortably wear a seatbelt, and you can safely maneuver your car and apply brakes b. Return to Work: ___________________________________ 9. You should see your doctor in the office for a follow-up appointment approximately two weeks after your surgery.  Make sure that you call for this appointment within a day or two after you arrive home to insure a convenient appointment time. OTHER INSTRUCTIONS:  _____________________________________________________________ _____________________________________________________________  WHEN TO  CALL YOUR DOCTOR: 1. Fever over 101.0 2. Inability to urinate 3. Nausea and/or vomiting 4. Extreme swelling or bruising 5. Continued bleeding from incision. 6. Increased pain, redness, or drainage from the incision. 7. Difficulty swallowing or breathing 8. Muscle cramping or spasms. 9. Numbness or tingling in hands or feet or around lips.  The clinic staff is available to answer your questions during regular business hours.  Please dont hesitate to call and ask to speak to one of the nurses if you have concerns.  For further questions, please visit [email or web address]

## 2016-01-09 NOTE — Discharge Planning (Cosign Needed)
Physician Discharge Summary  Patient ID: [name] ChickROCIO Marshall MRN: [id number] DOB/AGE: 20-May-1986 29 y.o.  Admit date: 01/04/2016 Discharge date: 01/09/2016  Admission Diagnoses:  appendicitis with cecal mass    Discharge Diagnoses:  Active Problems:   Abdominal pain   PROCEDURES: APPENDECTOMY LAPAROSCOPIC, OPEN TERMINAL ILEUM RESECTION, CECECTOMY, [facility]05[facility]16[facility]   Hospital Course:  Asked to see patient at the request of Dr Patria Maneampos for 1 day history of abdominal pain right lower quadrant. Pain started yesterday. Not severe currently but she has Bouts of episodic crampy abdominal pain. No blood in her stool. No severe episodes of constipation. CT scan shows Acute appendicitis with possible abscess vs cecal mass with obstruction of appendix.  She was admitted and taken to the OR the next AM.   Pathology came back benign but with perforation as noted below.  We kept her on IV antibiotics for 5 days and sent her home on 5 more days of oral Augmentin.  Her diet was advanced as her bowel function returned.  Her staples will come out in the office next week and we are arranging follow up with Dr Luisa Hartornett in 2-3 weeks. She does not speak [place], and we used an interepter from the phone service for all communications.    Condition on d/c:  Improved      Disposition: 01-Home or Self Care     Medication List    TAKE these medications        acetaminophen 325 MG tablet  Commonly known as:  TYLENOL  You can take plain Tylenol (acetaminophen) or you can take the ibuprofen or prescription pain pill. The prescription pain pill has Tylenol (acetaminophen) in it, so you have to count it with the prescription pain pill.  You cannot take more than 4000 mg of Tylenol (acetaminophen) per day.     amoxicillin-clavulanate 875-125 MG tablet  Commonly known as:  AUGMENTIN  Take 1 tablet by mouth every 12 (twelve) hours.     ibuprofen 200 MG tablet  Commonly known as:  ADVIL,MOTRIN   Take 400 mg by mouth every 6 (six) hours as needed for mild pain or moderate pain.     oxyCODONE-acetaminophen 5-325 MG tablet  Commonly known as:  ROXICET  Take 1-2 tablets by mouth every 4 (four) hours as needed for severe pain.     saccharomyces boulardii 250 MG capsule  Commonly known as:  FLORASTOR  You can buy this over the counter and use for at least one more week.  Any brand will do. It helps replace good bacteria in the colon we kill with antibiotics.           Follow-up Information    Follow up with Harriette BouillonORNETT,THOMAS A., MD On 01/30/2016.   Specialty:  General Surgery   Why:  Your appointment is at 11:50 AM, be at the office 30 minutes early for check in.   Contact information:   [address] [place] [address] [phone]       Follow up with [facility] On 01/14/2016.   Specialty:  General Surgery   Why:  Your appointment is for staple removal at 2:00 PM.  Be there 30 minutes early for check in   Contact information:   [address] [place] [address] [phone]       Signed: [name],[name] 01/09/2016, 4:01 PM

## 2016-01-09 NOTE — Progress Notes (Signed)
4 Days Post-Op  Subjective: [name] Marshall [id number]  interperter  She looks good this AM, tolerating diet and anxious to go home.  Objective: Vital signs in last 24 hours: Temp:  [98.4 F (36.9 C)-99.5 F (37.5 C)] 98.9 F (37.2 C) (04/20 0459) Pulse Rate:  [51-91] 60 (04/20 0459) Resp:  [15-16] 15 (04/20 0459) BP: (113-130)/(60-64) 130/60 mmHg (04/20 0459) SpO2:  [99 %-100 %] 99 % (04/20 0459) Last BM Date: 01/04/16 PO soft diet for breakfast Stool x 1 Afebrile, VSS WBC is normal No film Intake/Output from previous day: 04/19 0701 - 04/20 0700 In: 1300 [I.V.:1300] Out: 401 [Urine:400; Stool:1] Intake/Output this shift: Total I/O In: 200 [I.V.:200] Out: -   General appearance: alert, cooperative and no distress GI: soft, sore, tolerting diet, incision looks fine.    Lab Results:   Recent Labs  01/07/16 0430 01/09/16 0601  WBC 10.3 9.1  HGB 9.6* 10.3*  HCT 28.8* 31.0*  PLT 253 335    BMET  Recent Labs  01/07/16 0430  NA 138  K 3.7  CL 108  CO2 23  GLUCOSE 118*  BUN <5*  CREATININE 0.51  CALCIUM 8.0*   PT/INR No results for input(s): LABPROT, INR in the last 72 hours.   Recent Labs Lab 01/04/16 1345 01/05/16 0512 01/06/16 0422  AST 81* 79* 57*  ALT 38 31 29  ALKPHOS 62 56 51  BILITOT 0.8 0.7 0.3  PROT 7.9 6.5 6.4*  ALBUMIN 4.2 3.2* 3.0*     Lipase     Component Value Date/Time   LIPASE 16 01/04/2016 1345     Studies/Results: No results found.  Medications: . acetaminophen  1,000 mg Oral 4 times per day  . amoxicillin-clavulanate  1 tablet Oral Q12H  . enoxaparin (LOVENOX) injection  40 mg Subcutaneous Q24H  . pantoprazole (PROTONIX) IV  40 mg Intravenous QHS  . saccharomyces boulardii  250 mg Oral BID    Assessment/Plan Appendicitis with cecal mass APPENDECTOMY LAPAROSCOPIC, OPEN TERMINAL ILEUM RESECTION, CECECTOMY,01/05/16, Dr. Luisa Hartornett Post op ileus Antibiotics: Day 5 Ceftriaxone/Flagyl Completed; starting Augmentin  DVT:  Lovenox/SCD   Plan:  Home today on 5 more days of Augmentin.     LOS: 5 days    Leiliana Foody 01/09/2016 [phone]

## 2016-01-15 NOTE — Discharge Summary (Signed)
Patient ID: [name] Marshall MRN: [id number] DOB/AGE: 05/17/1986 29 y.o.  Admit date: 01/04/2016 Discharge date: 01/09/2016  Admission Diagnoses:  appendicitis with cecal mass    Discharge Diagnoses:  Active Problems:  Abdominal pain   PROCEDURES: APPENDECTOMY LAPAROSCOPIC, OPEN TERMINAL ILEUM RESECTION, CECECTOMY, 01/05/16, [facility]   [facility] Course:  Asked to see patient at the request of Dr Patria Maneampos for 1 day history of abdominal pain right lower quadrant. Pain started yesterday. Not severe currently but she has Bouts of episodic crampy abdominal pain. No blood in her stool. No severe episodes of constipation. CT scan shows Acute appendicitis with possible abscess vs cecal mass with obstruction of appendix. She was admitted and taken to the OR the next AM.  Pathology came back benign but with perforation as noted below. We kept her on IV antibiotics for 5 days and sent her home on 5 more days of oral Augmentin. Her diet was advanced as her bowel function returned. Her staples will come out in the office next week and we are arranging follow up with Dr Luisa Hartornett in 2-3 weeks. She does not speak [place], and we used an interepter from the phone service for all communications.   Condition on d/c: Improved     Disposition: 01-Home or Self Care     Medication List    TAKE these medications       acetaminophen 325 MG tablet  Commonly known as: TYLENOL  You can take plain Tylenol (acetaminophen) or you can take the ibuprofen or prescription pain pill. The prescription pain pill has Tylenol (acetaminophen) in it, so you have to count it with the prescription pain pill. You cannot take more than 4000 mg of Tylenol (acetaminophen) per day.     amoxicillin-clavulanate 875-125 MG tablet  Commonly known as: AUGMENTIN  Take 1 tablet by mouth every 12 (twelve) hours.     ibuprofen 200 MG tablet  Commonly known as: ADVIL,MOTRIN  Take 400  mg by mouth every 6 (six) hours as needed for mild pain or moderate pain.     oxyCODONE-acetaminophen 5-325 MG tablet  Commonly known as: ROXICET  Take 1-2 tablets by mouth every 4 (four) hours as needed for severe pain.     saccharomyces boulardii 250 MG capsule  Commonly known as: FLORASTOR  You can buy this over the counter and use for at least one more week. Any brand will do. It helps replace good bacteria in the colon we kill with antibiotics.           Follow-up Information    Follow up with Harriette BouillonORNETT,THOMAS A., MD On 01/30/2016.   Specialty: General Surgery   Why: Your appointment is at 11:50 AM, be at the office 30 minutes early for check in.   Contact information:   [address] [place] [address] [phone]       Follow up with [facility] On 01/14/2016.   Specialty: General Surgery   Why: Your appointment is for staple removal at 2:00 PM. Be there 30 minutes early for check in   Contact information:   [address] [place] [address] [phone]       Signed: [name],Perian Tedder 01/09/2016, 4:01 PM

## 2016-10-11 IMAGING — CT CT ABD-PELV W/ CM
2 of 4 series · 15 of 46 positions shown, 17 images · IV contrast (iopamidol)
Comparison: No priors.

CLINICAL DATA: 29-year-old female presenting with left lower
quadrant abdominal pain.

EXAM:
CT ABDOMEN AND PELVIS WITH CONTRAST
TECHNIQUE: Multidetector CT imaging of the abdomen and pelvis was performed
using the standard protocol following bolus administration of
intravenous contrast.
CONTRAST:  100mL CQFIL2-577 IOPAMIDOL (CQFIL2-577) INJECTION 61%

[Series 2: abd/pel with · axial · 0.69mm/px · z∈[-455,+5]mm · 12 of 102 slices shown, 14 images]
[im 5/102  soft-tissue]
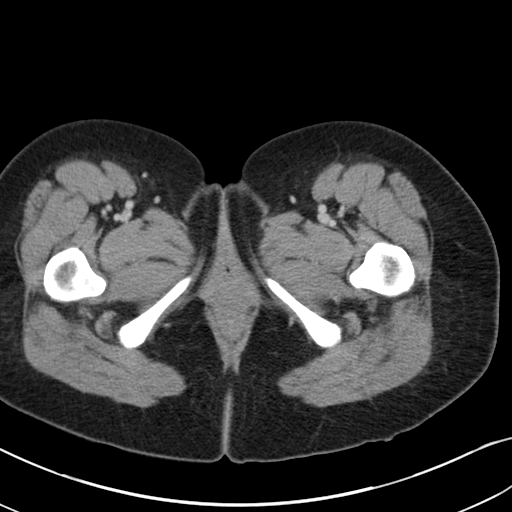
[im 5/102  bone]
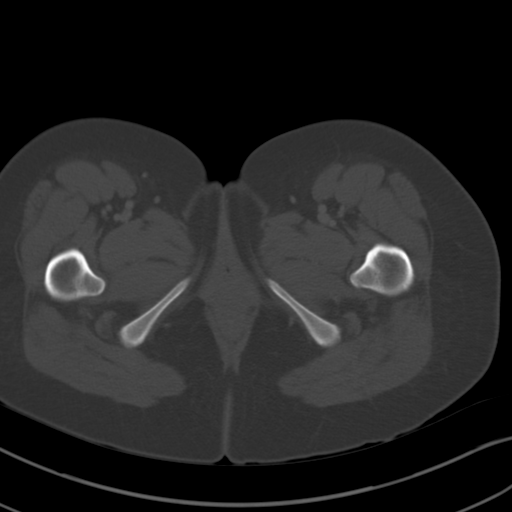
[im 15/102  soft-tissue]
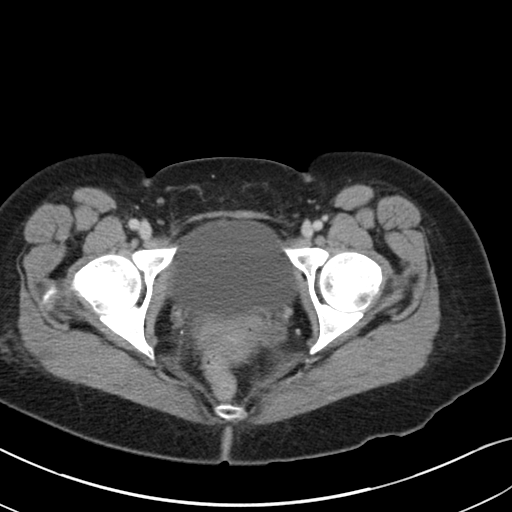
[im 25/102  soft-tissue]
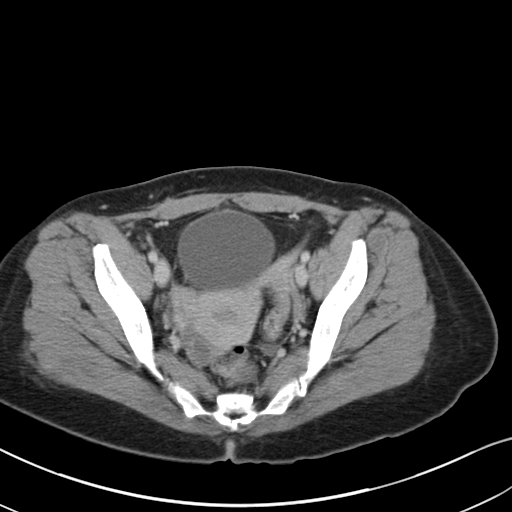
[im 29/102  soft-tissue]
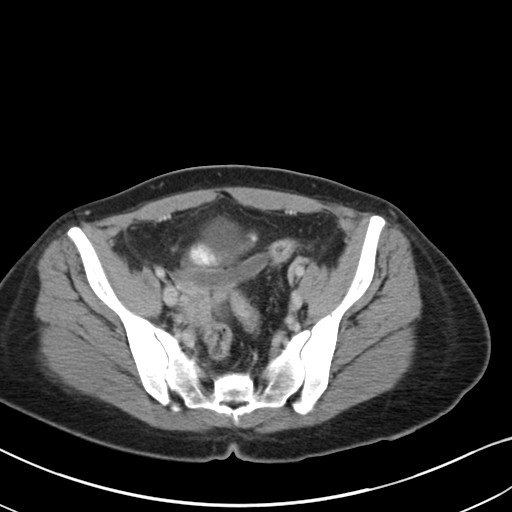
[im 39/102  soft-tissue]
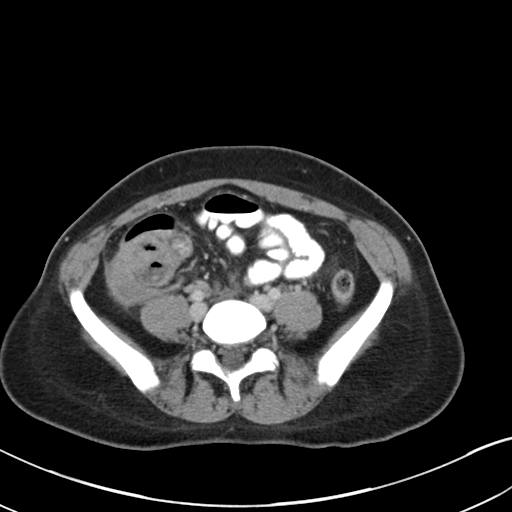
[im 49/102  soft-tissue]
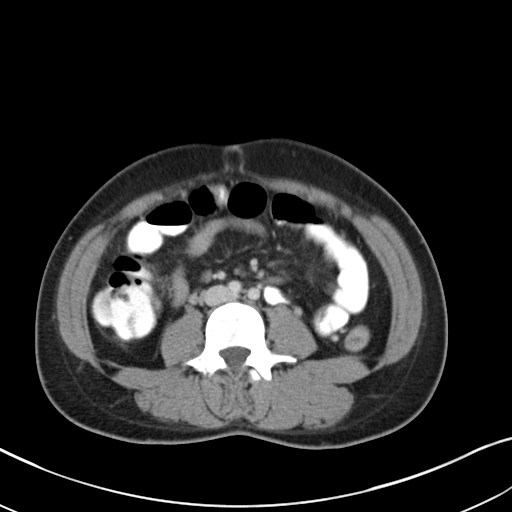
[im 53/102  soft-tissue]
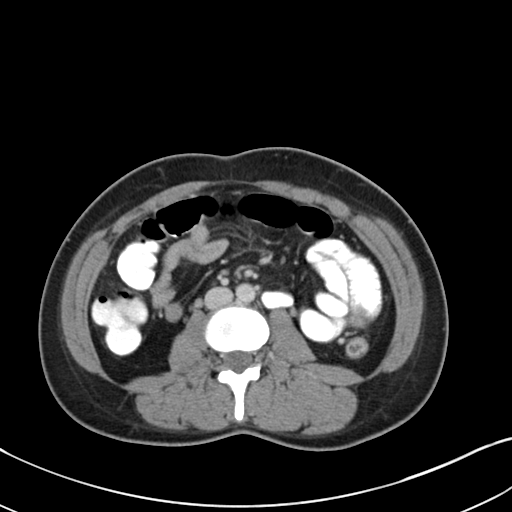
[im 63/102  soft-tissue]
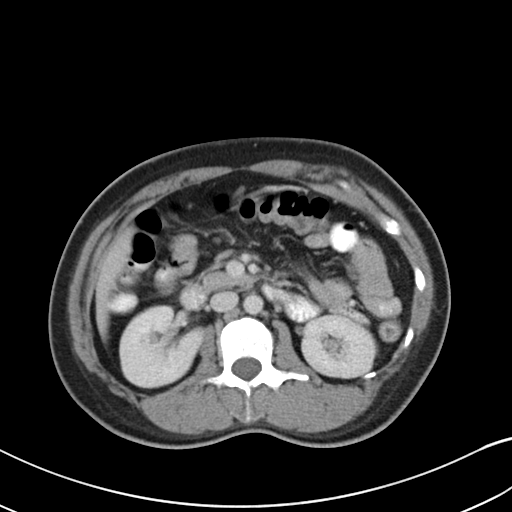
[im 73/102  soft-tissue]
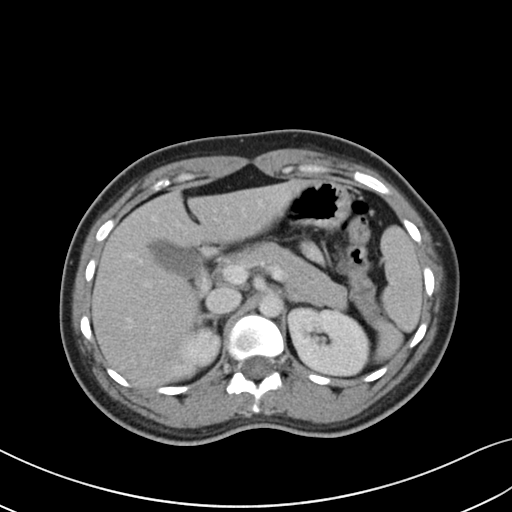
[im 73/102  bone]
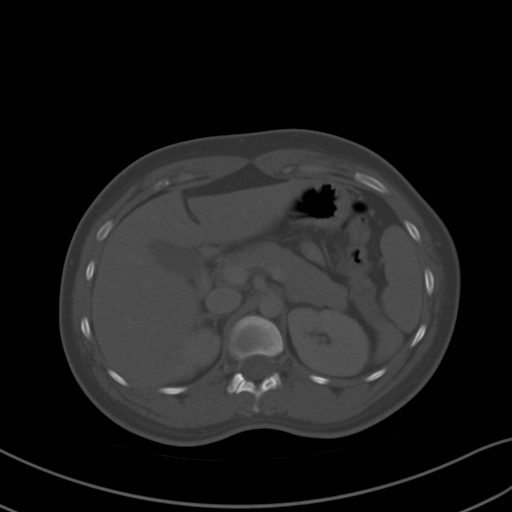
[im 77/102  soft-tissue]
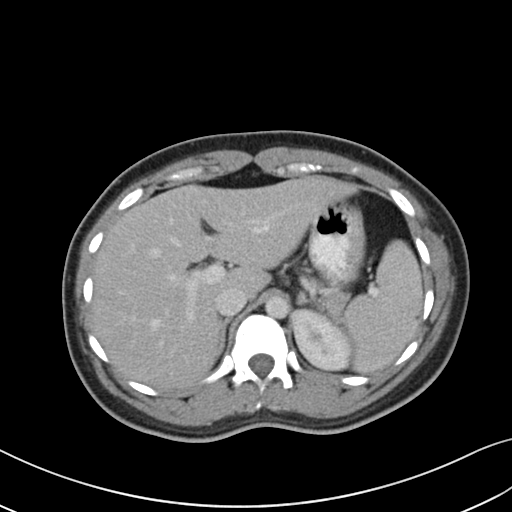
[im 87/102  soft-tissue]
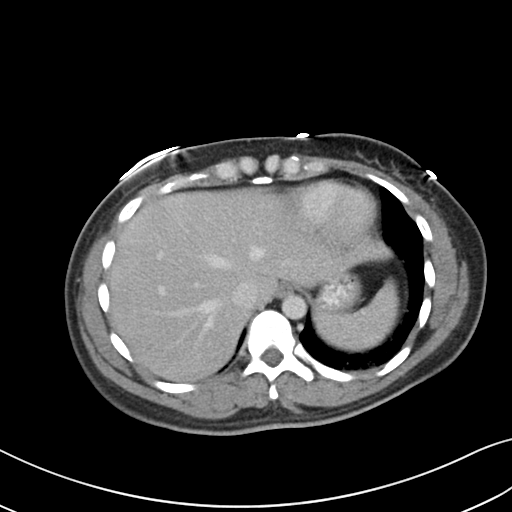
[im 97/102  soft-tissue]
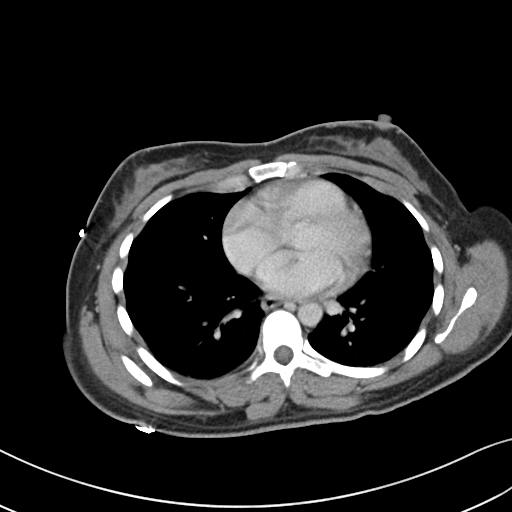

[Series 5: coronal a/|p · coronal · 0.74mm/px · 3 of 118 slices shown]
[im 40/118  soft-tissue]
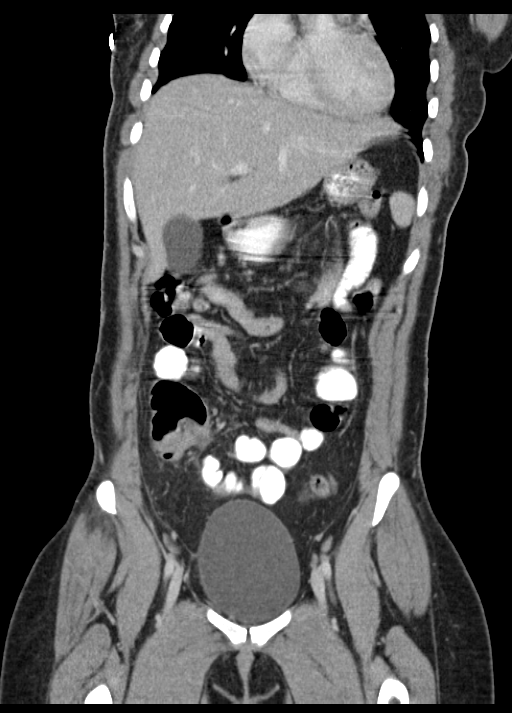
[im 53/118  soft-tissue]
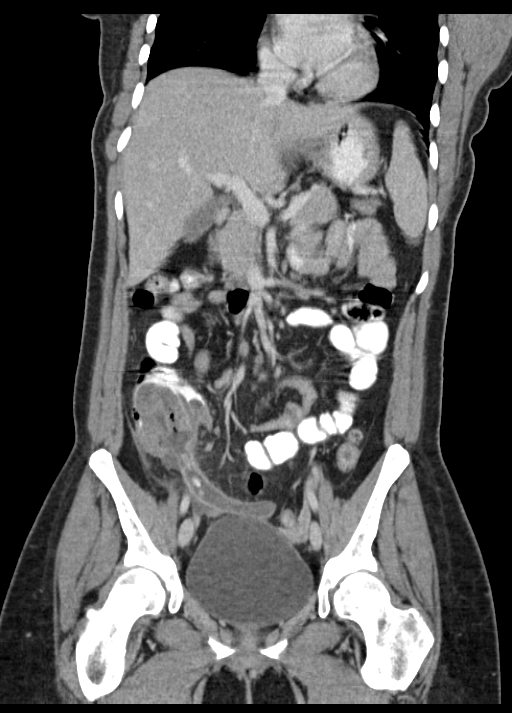
[im 66/118  soft-tissue]
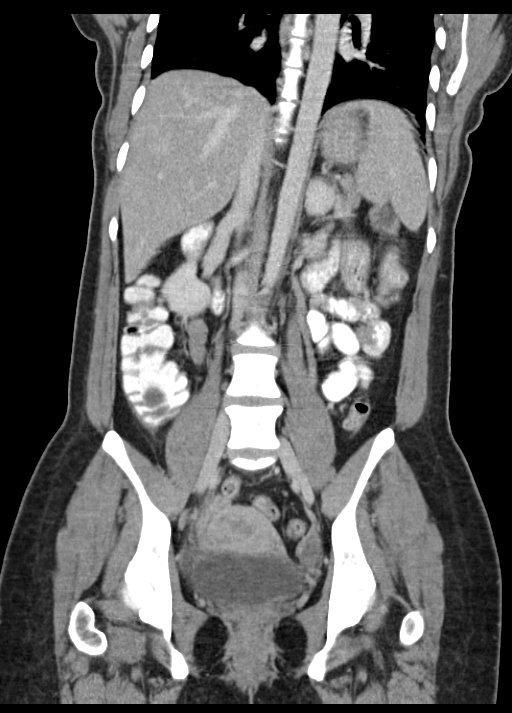

[15 of 46 positions shown; findings below may reference images not displayed]

FINDINGS: Lower chest:  Unremarkable.

Hepatobiliary: No cystic or solid hepatic lesions. No intra or
extrahepatic biliary ductal dilatation. Gallbladder is normal in
appearance.

Pancreas: No pancreatic mass. No pancreatic ductal dilatation. No
pancreatic or peripancreatic fluid or inflammatory changes.

Spleen: Unremarkable.

Adrenals/Urinary Tract: Bilateral adrenal glands and bilateral
kidneys are normal in appearance. No hydroureteronephrosis. Urinary
bladder is normal in appearance.

Stomach/Bowel: Normal appearance of the stomach. No pathologic
dilatation of small bowel or colon. The appendix is markedly
enlarged measuring up to 13 mm in diameter, and appears inflamed
with surrounding fat stranding. Notably, in the neck of the appendix
there are several small appendicoliths. There is also some
inflammatory thickening of the adjacent portion of the cecum.
Specifically, coronal image 59 of series 5 and sagittal image 40 of
series 6 demonstrate a low-attenuation rim enhancing collection
which appears to be intimately associated with the wall of the
cecum, surrounded by a thickened mural tissue, which is concerning
for potential periappendiceal abscess that has extended into the
wall of the cecum.

Vascular/Lymphatic: No significant atherosclerotic disease, aneurysm
or dissection identified in the abdominal or pelvic vasculature. No
lymphadenopathy noted in the abdomen or pelvis.

Reproductive: Uterus and ovaries are unremarkable in appearance.

Other: Small amount of periappendiceal free fluid. Trace volume of
ascites. No pneumoperitoneum.

Musculoskeletal: There are no aggressive appearing lytic or blastic
lesions noted in the visualized portions of the skeleton.
IMPRESSION: 1. Acute appendicitis with possible periappendiceal abscess forming
in the posterior wall of the cecum, as discussed above. Adjacent
small amount of periappendiceal free fluid. Surgical consultation is
strongly recommended.
2. Additional incidental findings, as above.
These results were called by telephone at the time of interpretation
on 01/04/2016 at [DATE] to Dr. AMWIIGIDHA DESHY, who verbally
acknowledged these results.

## 2020-10-09 ENCOUNTER — Encounter: Payer: Self-pay | Admitting: Emergency Medicine

## 2020-10-09 ENCOUNTER — Other Ambulatory Visit: Payer: Self-pay

## 2020-10-09 ENCOUNTER — Ambulatory Visit
Admission: EM | Admit: 2020-10-09 | Discharge: 2020-10-09 | Disposition: A | Discharge status: Home / Self Care | Payer: Self-pay | Attending: Emergency Medicine | Admitting: Emergency Medicine

## 2020-10-09 DIAGNOSIS — Z20822 Contact with and (suspected) exposure to covid-19: Secondary | ICD-10-CM

## 2020-10-09 MED ORDER — FLUTICASONE PROPIONATE 50 MCG/ACT NA SUSP: 1.0000 | Freq: Every day | NASAL | 0 refills | Status: AC

## 2020-10-09 MED ORDER — DM-GUAIFENESIN ER 30-600 MG PO TB12: 1.0000 | ORAL_TABLET | Freq: Two times a day (BID) | ORAL | 0 refills | Status: AC

## 2020-10-09 MED ORDER — BENZONATATE 200 MG PO CAPS: 200.0000 mg | ORAL_CAPSULE | Freq: Three times a day (TID) | ORAL | 0 refills | Status: AC | PRN

## 2020-10-09 NOTE — ED Provider Notes (Signed)
[facility]    CSN: [id number] Arrival date & time: 10/09/20  1007      History   Chief Complaint Chief Complaint  Patient presents with  . Nasal Congestion    HPI [name] Marshall is a 35 y.o. female presenting today for evaluation of URI symptoms.  Reports sore throat congestion and headache. Reports congestion for 1 week, recently discolored with a small amount of blood. Husband positive for COVID.  Denies fevers, chills, body aches. Taking dayquil without relief.   HPI  Past Medical History:  Diagnosis Date  . Gestational diabetes    with second pregnancy    Patient Active Problem List   Diagnosis Date Noted  . Abdominal pain 01/04/2016    Past Surgical History:  Procedure Laterality Date  . CESAREAN SECTION    . LAPAROSCOPIC APPENDECTOMY N/A 01/05/2016   Procedure: APPENDECTOMY LAPAROSCOPIC, OPEN TERMINAL ILEUM RESECTION, CECECTOMY, ;  Surgeon: Harriette Bouillon, MD;  Location: [facility];  Service: General;  Laterality: N/A;    OB History    Gravida  3   Para  2   Term  2   Preterm  0   AB  0   Living  2     SAB  0   IAB  0   Ectopic  0   Multiple  0   Live Births               Home Medications    Prior to Admission medications   Medication Sig Start Date End Date Taking? Authorizing Provider  benzonatate (TESSALON) 200 MG capsule Take 1 capsule (200 mg total) by mouth 3 (three) times daily as needed for up to 7 days for cough. 10/09/20 10/16/20 Yes Shequilla Goodgame C, PA-C  dextromethorphan-guaiFENesin (MUCINEX DM) 30-600 MG 12hr tablet Take 1 tablet by mouth 2 (two) times daily. 10/09/20  Yes Krystel Fletchall C, PA-C  fluticasone (FLONASE) 50 MCG/ACT nasal spray Place 1-2 sprays into both nostrils daily. 10/09/20  Yes Daylani Deblois C, PA-C  acetaminophen (TYLENOL) 325 MG tablet You can take plain Tylenol (acetaminophen) or you can take the ibuprofen or prescription pain pill. The prescription pain pill has Tylenol (acetaminophen) in  it, so you have to count it with the prescription pain pill.  You cannot take more than 4000 mg of Tylenol (acetaminophen) per day. 01/09/16   Sherrie George, PA-C  ibuprofen (ADVIL,MOTRIN) 200 MG tablet Take 400 mg by mouth every 6 (six) hours as needed for mild pain or moderate pain.     [provider]  saccharomyces boulardii (FLORASTOR) 250 MG capsule You can buy this over the counter and use for at least one more week.  Any brand will do. It helps replace good bacteria in the colon we kill with antibiotics. 01/09/16   Sherrie George, PA-C    Family History History reviewed. No pertinent family history.  Social History Social History   Tobacco Use  . Smoking status: Never Smoker  . Smokeless tobacco: Never Used  Substance Use Topics  . Alcohol use: No  . Drug use: No     Allergies   Patient has no known allergies.   Review of Systems Review of Systems  Constitutional: Negative for activity change, appetite change, chills, fatigue and fever.  HENT: Positive for congestion, rhinorrhea and sinus pressure. Negative for ear pain, sore throat and trouble swallowing.   Eyes: Negative for discharge and redness.  Respiratory: Positive for cough. Negative for chest tightness and shortness of  breath.   Cardiovascular: Negative for chest pain.  Gastrointestinal: Negative for abdominal pain, diarrhea, nausea and vomiting.  Musculoskeletal: Negative for myalgias.  Skin: Negative for rash.  Neurological: Negative for dizziness, light-headedness and headaches.     Physical Exam Triage Vital Signs ED Triage Vitals  Enc Vitals Group     BP      Pulse      Resp      Temp      Temp src      SpO2      Weight      Height      Head Circumference      Peak Flow      Pain Score      Pain Loc      Pain Edu?      Excl. in GC?    No data found.  Updated Vital Signs BP (!) 161/67 (BP Location: Left Arm)   Pulse 97   Temp 98.1 F (36.7 C) (Oral)   Resp 18   SpO2  98%   Visual Acuity Right Eye Distance:   Left Eye Distance:   Bilateral Distance:    Right Eye Near:   Left Eye Near:    Bilateral Near:     Physical Exam Vitals and nursing note reviewed.  Constitutional:      Appearance: She is well-developed and well-nourished.     Comments: No acute distress  HENT:     Head: Normocephalic and atraumatic.     Ears:     Comments: Bilateral ears without tenderness to palpation of external auricle, tragus and mastoid, EAC's without erythema or swelling, TM's with good bony landmarks and cone of light. Non erythematous.     Nose: Nose normal.     Mouth/Throat:     Comments: Oral mucosa pink and moist, no tonsillar enlargement or exudate. Posterior pharynx patent and nonerythematous, no uvula deviation or swelling. Normal phonation. Eyes:     Conjunctiva/sclera: Conjunctivae normal.  Cardiovascular:     Rate and Rhythm: Normal rate.  Pulmonary:     Effort: Pulmonary effort is normal. No respiratory distress.     Comments: Breathing comfortably at rest, CTABL, no wheezing, rales or other adventitious sounds auscultated Abdominal:     General: There is no distension.  Musculoskeletal:        General: Normal range of motion.     Cervical back: Neck supple.  Skin:    General: Skin is warm and dry.  Neurological:     Mental Status: She is alert and oriented to person, place, and time.  Psychiatric:        Mood and Affect: Mood and affect normal.      UC Treatments / Results  Labs (all labs ordered are listed, but only abnormal results are displayed) Labs Reviewed  NOVEL CORONAVIRUS, NAA    EKG   Radiology No results found.  Procedures Procedures (including critical care time)  Medications Ordered in UC Medications - No data to display  Initial Impression / Assessment and Plan / UC Course  I have reviewed the triage vital signs and the nursing notes.  Pertinent labs & imaging results that were available during my care of  the patient were reviewed by me and considered in my medical decision making (see chart for details).     COVID exposure at home, suspect most likely COVID.  Recommending continued symptomatic and supportive care, exam reassuring.  Rest and fluids.  Discussed strict return precautions. Patient  verbalized understanding and is agreeable with plan.  Final Clinical Impressions(s) / UC Diagnoses   Final diagnoses:  Encounter for screening laboratory testing for COVID-19 virus  Suspected COVID-19 virus infection     Discharge Instructions     COVID test pending May use Flonase nasal spray 1 to 2 spray in each nostril daily to help with congestion Mucinex DM twice daily to further help with congestion and cough (may get over-the-counter) Tessalon every 8 hours for cough Rest and fluids Follow-up if not improving or worsening    ED Prescriptions    Medication Sig Dispense Auth. Provider   benzonatate (TESSALON) 200 MG capsule Take 1 capsule (200 mg total) by mouth 3 (three) times daily as needed for up to 7 days for cough. 28 capsule Rona Tomson C, PA-C   dextromethorphan-guaiFENesin (MUCINEX DM) 30-600 MG 12hr tablet Take 1 tablet by mouth 2 (two) times daily. 20 tablet Heela Heishman C, PA-C   fluticasone (FLONASE) 50 MCG/ACT nasal spray Place 1-2 sprays into both nostrils daily. 16 g Mathew Postiglione, [place] C, PA-C     PDMP not reviewed this encounter.   Sharyon Cable Floyd C, PA-C 10/09/20 1301

## 2020-10-09 NOTE — Discharge Instructions (Signed)
COVID test pending May use Flonase nasal spray 1 to 2 spray in each nostril daily to help with congestion Mucinex DM twice daily to further help with congestion and cough (may get over-the-counter) Tessalon every 8 hours for cough Rest and fluids Follow-up if not improving or worsening

## 2020-10-09 NOTE — ED Triage Notes (Signed)
Pt here for nasal congestion x 5 days; denies fever

## 2020-10-11 LAB — NOVEL CORONAVIRUS, NAA: SARS-CoV-2, NAA: NOT DETECTED

## 2020-10-11 LAB — SARS-COV-2, NAA 2 DAY TAT
